# Patient Record
Sex: Male | Born: 1935 | Race: Black or African American | Hispanic: No | Marital: Married | Smoking: Former smoker
Health system: Southern US, Community
[De-identification: ages and names within clinical notes are randomized; demographics above are authoritative.]

## PROBLEM LIST (undated history)

## (undated) DIAGNOSIS — C189 Malignant neoplasm of colon, unspecified: Secondary | ICD-10-CM

## (undated) DIAGNOSIS — E7800 Pure hypercholesterolemia, unspecified: Secondary | ICD-10-CM

## (undated) DIAGNOSIS — I10 Essential (primary) hypertension: Secondary | ICD-10-CM

## (undated) HISTORY — PX: COLON SURGERY: SHX602

---

## 2004-12-13 ENCOUNTER — Emergency Department: Payer: Self-pay | Admitting: General Practice

## 2005-01-07 ENCOUNTER — Ambulatory Visit: Payer: Self-pay | Admitting: Specialist

## 2006-01-12 ENCOUNTER — Ambulatory Visit: Payer: Self-pay | Admitting: Urology

## 2007-07-24 ENCOUNTER — Ambulatory Visit: Payer: Self-pay | Admitting: Gastroenterology

## 2008-10-08 ENCOUNTER — Ambulatory Visit: Payer: Self-pay | Admitting: Internal Medicine

## 2011-02-08 ENCOUNTER — Ambulatory Visit: Payer: Self-pay | Admitting: Internal Medicine

## 2012-07-11 ENCOUNTER — Ambulatory Visit: Payer: Self-pay | Admitting: Urology

## 2012-07-11 LAB — HEMOGLOBIN: HGB: 13.9 g/dL (ref 13.0–18.0)

## 2012-07-16 ENCOUNTER — Ambulatory Visit: Payer: Self-pay | Admitting: Urology

## 2012-12-10 ENCOUNTER — Emergency Department: Payer: Self-pay | Admitting: Emergency Medicine

## 2014-08-21 DIAGNOSIS — I10 Essential (primary) hypertension: Secondary | ICD-10-CM | POA: Insufficient documentation

## 2014-11-13 DIAGNOSIS — I10 Essential (primary) hypertension: Secondary | ICD-10-CM | POA: Diagnosis not present

## 2014-11-21 DIAGNOSIS — E785 Hyperlipidemia, unspecified: Secondary | ICD-10-CM | POA: Insufficient documentation

## 2014-11-24 DIAGNOSIS — H4011X2 Primary open-angle glaucoma, moderate stage: Secondary | ICD-10-CM | POA: Diagnosis not present

## 2015-02-13 DIAGNOSIS — I10 Essential (primary) hypertension: Secondary | ICD-10-CM | POA: Diagnosis not present

## 2015-02-20 DIAGNOSIS — N289 Disorder of kidney and ureter, unspecified: Secondary | ICD-10-CM | POA: Diagnosis not present

## 2015-02-20 DIAGNOSIS — I10 Essential (primary) hypertension: Secondary | ICD-10-CM | POA: Diagnosis not present

## 2015-02-24 NOTE — H&P (Signed)
PATIENT NAME:  Gabriel Thompson, Gabriel Thompson MR#:  [id number] DATE OF BIRTH:  [birth year]  DATE OF ADMISSION:  07/16/2012  CHIEF COMPLAINT: Difficulty voiding.   HISTORY OF PRESENT ILLNESS: Mr. Balsam is a 81 year old African American male with a long history of difficulty voiding. He has been treated with finasteride 5 mg per day for a number of years. His symptoms have worsened and his PSA has risen. He was evaluated with an ultrasound-guided biopsy of the prostate June 21st which indicated a 64.9 gram prostate with benign histology. He comes in now for photovaporization of the prostate with green light laser.  ALLERGIES: No drug allergies.   CURRENT MEDICATIONS:  1. Finasteride. 2. HCTZ. 3. Simvastatin.   PAST SURGICAL HISTORY: TUMT in 2010.   SOCIAL HISTORY: The patient quit smoking 20 years ago. He denied alcohol use.   FAMILY HISTORY: Noncontributory.   PAST AND CURRENT MEDICAL CONDITIONS:  1. Allergic rhinitis.  2. Hypertension. 3. Hypercholesterolemia.  REVIEW OF SYSTEMS: The patient denied chest pain, shortness of breath, diabetes, or stroke.   PHYSICAL EXAMINATION:    GENERAL: Well nourished African American male in no acute distress.   HEENT: Sclerae were clear. Pupils were equally round and reactive to light and accommodation. Extraocular movements were intact.   NECK: Supple. No palpable adenopathy. No audible carotid bruits.   LUNGS: Clear to auscultation.   CARDIOVASCULAR: Regular rhythm and rate without audible murmurs.   ABDOMEN: Soft, nontender abdomen.   GENITOURINARY: Uncircumcised. Testes were smooth and nontender, 25 mL in size each.   RECTAL: Greater than 50 gram smooth nontender prostate.   NEUROMUSCULAR: Alert and oriented x3.   IMPRESSION:  1. Benign prostatic hypertrophy with bladder outlet obstruction.  2. Elevated PSA.   PLAN: Photovaporization of the prostate with the green light laser.   ____________________________ Otelia Limes. Yves Dill,  MD [email or web address] D: 07/11/2012 12:58:04 ET T: 07/11/2012 13:26:05 ET JOB#: [id number]  cc: Otelia Limes. Yves Dill, MD, <Dictator> Royston Cowper MD ELECTRONICALLY SIGNED 07/12/2012 9:14

## 2015-02-24 NOTE — Op Note (Signed)
PATIENT NAME:  Gabriel Thompson, Gabriel Thompson MR#:  [id number] DATE OF BIRTH:  20-Feb-1934  DATE OF PROCEDURE:  07/16/2012  PREOPERATIVE DIAGNOSIS: Benign prostatic hypertrophy with bladder outlet obstruction.   POSTOPERATIVE DIAGNOSIS: Benign prostatic hypertrophy with bladder outlet obstruction.   PROCEDURE PERFORMED: Photovaporization of the prostate with GreenLight laser.   SURGEON: Otelia Limes. Yves Dill, MD  ANESTHETIST: Dr. [place] Service  ANESTHETIC METHOD: General.   INDICATIONS: See the dictated history and physical. After informed consent, patient requests above procedure.   OPERATIVE SUMMARY: After adequate anesthesia had been obtained, patient was placed into dorsal lithotomy position and the perineum was prepped and draped in the usual fashion. Laser scope was coupled with the camera and then visually advanced into the bladder. Bladder was heavily trabeculated. Both ureteral orifices were identified and had clear efflux. No bladder tumors were identified. Patient had lateral lobe prostatic hypertrophy. At this point, the XPS laser fiber was introduced through the scope and power setting placed at 80 watts. Bladder neck tissue was vaporized. Power was then increased to 150 watts and midportion of the prostatic tissue was vaporized. Finally, the power was increased to 180 watts and remaining obstructive tissue from the bladder neck to the verumontanum was vaporized. At this point, the scope was removed and a 20 [place] silicone catheter placed. Catheter was irrigated until clear. A B and O suppository was placed. Procedure was then terminated and patient was transferred to the recovery room in stable condition.    ____________________________ Otelia Limes. Yves Dill, MD mrw:cms D: 07/16/2012 09:20:16 ET T: 07/16/2012 11:33:34 ET  JOB#: [id number] cc: Otelia Limes. Yves Dill, MD, <Dictator> Royston Cowper MD ELECTRONICALLY SIGNED 07/17/2012 8:21

## 2015-03-18 DIAGNOSIS — H4011X2 Primary open-angle glaucoma, moderate stage: Secondary | ICD-10-CM | POA: Diagnosis not present

## 2015-03-18 DIAGNOSIS — H5210 Myopia, unspecified eye: Secondary | ICD-10-CM | POA: Diagnosis not present

## 2015-03-18 DIAGNOSIS — Z0100 Encounter for examination of eyes and vision without abnormal findings: Secondary | ICD-10-CM | POA: Diagnosis not present

## 2015-03-18 DIAGNOSIS — Z961 Presence of intraocular lens: Secondary | ICD-10-CM | POA: Diagnosis not present

## 2015-03-18 DIAGNOSIS — H524 Presbyopia: Secondary | ICD-10-CM | POA: Diagnosis not present

## 2015-05-27 DIAGNOSIS — I10 Essential (primary) hypertension: Secondary | ICD-10-CM | POA: Diagnosis not present

## 2015-06-03 DIAGNOSIS — N289 Disorder of kidney and ureter, unspecified: Secondary | ICD-10-CM | POA: Diagnosis not present

## 2015-06-03 DIAGNOSIS — I10 Essential (primary) hypertension: Secondary | ICD-10-CM | POA: Diagnosis not present

## 2015-06-03 DIAGNOSIS — Z125 Encounter for screening for malignant neoplasm of prostate: Secondary | ICD-10-CM | POA: Diagnosis not present

## 2015-07-22 DIAGNOSIS — H4011X3 Primary open-angle glaucoma, severe stage: Secondary | ICD-10-CM | POA: Diagnosis not present

## 2015-08-21 DIAGNOSIS — H43811 Vitreous degeneration, right eye: Secondary | ICD-10-CM | POA: Diagnosis not present

## 2015-11-12 DIAGNOSIS — H401132 Primary open-angle glaucoma, bilateral, moderate stage: Secondary | ICD-10-CM | POA: Diagnosis not present

## 2015-11-19 DIAGNOSIS — Z125 Encounter for screening for malignant neoplasm of prostate: Secondary | ICD-10-CM | POA: Diagnosis not present

## 2015-11-19 DIAGNOSIS — I10 Essential (primary) hypertension: Secondary | ICD-10-CM | POA: Diagnosis not present

## 2015-11-19 DIAGNOSIS — N390 Urinary tract infection, site not specified: Secondary | ICD-10-CM | POA: Diagnosis not present

## 2015-11-26 DIAGNOSIS — N390 Urinary tract infection, site not specified: Secondary | ICD-10-CM | POA: Diagnosis not present

## 2015-11-26 DIAGNOSIS — R972 Elevated prostate specific antigen [PSA]: Secondary | ICD-10-CM | POA: Diagnosis not present

## 2015-11-26 DIAGNOSIS — Z0001 Encounter for general adult medical examination with abnormal findings: Secondary | ICD-10-CM | POA: Diagnosis not present

## 2015-11-26 DIAGNOSIS — N289 Disorder of kidney and ureter, unspecified: Secondary | ICD-10-CM | POA: Diagnosis not present

## 2015-11-26 DIAGNOSIS — I10 Essential (primary) hypertension: Secondary | ICD-10-CM | POA: Diagnosis not present

## 2015-11-26 DIAGNOSIS — E7800 Pure hypercholesterolemia, unspecified: Secondary | ICD-10-CM | POA: Diagnosis not present

## 2015-12-09 DIAGNOSIS — D400 Neoplasm of uncertain behavior of prostate: Secondary | ICD-10-CM | POA: Diagnosis not present

## 2015-12-09 DIAGNOSIS — N400 Enlarged prostate without lower urinary tract symptoms: Secondary | ICD-10-CM | POA: Diagnosis not present

## 2015-12-09 DIAGNOSIS — R351 Nocturia: Secondary | ICD-10-CM | POA: Diagnosis not present

## 2015-12-09 DIAGNOSIS — N401 Enlarged prostate with lower urinary tract symptoms: Secondary | ICD-10-CM | POA: Diagnosis not present

## 2015-12-09 DIAGNOSIS — R972 Elevated prostate specific antigen [PSA]: Secondary | ICD-10-CM | POA: Diagnosis not present

## 2015-12-15 DIAGNOSIS — R972 Elevated prostate specific antigen [PSA]: Secondary | ICD-10-CM | POA: Diagnosis not present

## 2015-12-15 DIAGNOSIS — N4231 Prostatic intraepithelial neoplasia: Secondary | ICD-10-CM | POA: Diagnosis not present

## 2015-12-24 DIAGNOSIS — R972 Elevated prostate specific antigen [PSA]: Secondary | ICD-10-CM | POA: Diagnosis not present

## 2015-12-24 DIAGNOSIS — N401 Enlarged prostate with lower urinary tract symptoms: Secondary | ICD-10-CM | POA: Diagnosis not present

## 2015-12-24 DIAGNOSIS — D400 Neoplasm of uncertain behavior of prostate: Secondary | ICD-10-CM | POA: Diagnosis not present

## 2016-02-11 DIAGNOSIS — H401131 Primary open-angle glaucoma, bilateral, mild stage: Secondary | ICD-10-CM | POA: Diagnosis not present

## 2016-02-17 DIAGNOSIS — R972 Elevated prostate specific antigen [PSA]: Secondary | ICD-10-CM | POA: Diagnosis not present

## 2016-02-17 DIAGNOSIS — I10 Essential (primary) hypertension: Secondary | ICD-10-CM | POA: Diagnosis not present

## 2016-02-24 DIAGNOSIS — N289 Disorder of kidney and ureter, unspecified: Secondary | ICD-10-CM | POA: Diagnosis not present

## 2016-02-24 DIAGNOSIS — I10 Essential (primary) hypertension: Secondary | ICD-10-CM | POA: Diagnosis not present

## 2016-02-24 DIAGNOSIS — D649 Anemia, unspecified: Secondary | ICD-10-CM | POA: Diagnosis not present

## 2016-05-20 DIAGNOSIS — D649 Anemia, unspecified: Secondary | ICD-10-CM | POA: Diagnosis not present

## 2016-05-20 DIAGNOSIS — N289 Disorder of kidney and ureter, unspecified: Secondary | ICD-10-CM | POA: Diagnosis not present

## 2016-05-26 DIAGNOSIS — I10 Essential (primary) hypertension: Secondary | ICD-10-CM | POA: Diagnosis not present

## 2016-05-26 DIAGNOSIS — D649 Anemia, unspecified: Secondary | ICD-10-CM | POA: Diagnosis not present

## 2016-06-08 DIAGNOSIS — R972 Elevated prostate specific antigen [PSA]: Secondary | ICD-10-CM | POA: Diagnosis not present

## 2016-06-08 DIAGNOSIS — N401 Enlarged prostate with lower urinary tract symptoms: Secondary | ICD-10-CM | POA: Diagnosis not present

## 2016-06-22 DIAGNOSIS — R972 Elevated prostate specific antigen [PSA]: Secondary | ICD-10-CM | POA: Diagnosis not present

## 2016-06-22 DIAGNOSIS — N401 Enlarged prostate with lower urinary tract symptoms: Secondary | ICD-10-CM | POA: Diagnosis not present

## 2016-06-22 DIAGNOSIS — R351 Nocturia: Secondary | ICD-10-CM | POA: Diagnosis not present

## 2016-06-22 DIAGNOSIS — D400 Neoplasm of uncertain behavior of prostate: Secondary | ICD-10-CM | POA: Diagnosis not present

## 2016-07-05 DIAGNOSIS — H40153 Residual stage of open-angle glaucoma, bilateral: Secondary | ICD-10-CM | POA: Diagnosis not present

## 2016-07-06 DIAGNOSIS — H5210 Myopia, unspecified eye: Secondary | ICD-10-CM | POA: Diagnosis not present

## 2016-07-06 DIAGNOSIS — H524 Presbyopia: Secondary | ICD-10-CM | POA: Diagnosis not present

## 2016-07-25 DIAGNOSIS — H905 Unspecified sensorineural hearing loss: Secondary | ICD-10-CM | POA: Diagnosis not present

## 2016-08-19 DIAGNOSIS — I10 Essential (primary) hypertension: Secondary | ICD-10-CM | POA: Diagnosis not present

## 2016-08-19 DIAGNOSIS — D649 Anemia, unspecified: Secondary | ICD-10-CM | POA: Diagnosis not present

## 2016-08-26 DIAGNOSIS — Z23 Encounter for immunization: Secondary | ICD-10-CM | POA: Diagnosis not present

## 2016-08-26 DIAGNOSIS — N289 Disorder of kidney and ureter, unspecified: Secondary | ICD-10-CM | POA: Diagnosis not present

## 2016-08-26 DIAGNOSIS — D649 Anemia, unspecified: Secondary | ICD-10-CM | POA: Diagnosis not present

## 2016-08-26 DIAGNOSIS — I10 Essential (primary) hypertension: Secondary | ICD-10-CM | POA: Diagnosis not present

## 2016-11-02 DIAGNOSIS — H40153 Residual stage of open-angle glaucoma, bilateral: Secondary | ICD-10-CM | POA: Diagnosis not present

## 2016-12-01 DIAGNOSIS — D649 Anemia, unspecified: Secondary | ICD-10-CM | POA: Diagnosis not present

## 2016-12-01 DIAGNOSIS — I10 Essential (primary) hypertension: Secondary | ICD-10-CM | POA: Diagnosis not present

## 2016-12-07 DIAGNOSIS — Z0000 Encounter for general adult medical examination without abnormal findings: Secondary | ICD-10-CM | POA: Diagnosis not present

## 2016-12-07 DIAGNOSIS — I10 Essential (primary) hypertension: Secondary | ICD-10-CM | POA: Diagnosis not present

## 2016-12-26 DIAGNOSIS — R351 Nocturia: Secondary | ICD-10-CM | POA: Diagnosis not present

## 2016-12-26 DIAGNOSIS — N401 Enlarged prostate with lower urinary tract symptoms: Secondary | ICD-10-CM | POA: Diagnosis not present

## 2016-12-26 DIAGNOSIS — Z79899 Other long term (current) drug therapy: Secondary | ICD-10-CM | POA: Diagnosis not present

## 2016-12-26 DIAGNOSIS — R972 Elevated prostate specific antigen [PSA]: Secondary | ICD-10-CM | POA: Diagnosis not present

## 2016-12-26 DIAGNOSIS — D400 Neoplasm of uncertain behavior of prostate: Secondary | ICD-10-CM | POA: Diagnosis not present

## 2017-02-28 DIAGNOSIS — I10 Essential (primary) hypertension: Secondary | ICD-10-CM | POA: Diagnosis not present

## 2017-03-02 DIAGNOSIS — H40153 Residual stage of open-angle glaucoma, bilateral: Secondary | ICD-10-CM | POA: Diagnosis not present

## 2017-03-07 DIAGNOSIS — Z0000 Encounter for general adult medical examination without abnormal findings: Secondary | ICD-10-CM | POA: Diagnosis not present

## 2017-03-07 DIAGNOSIS — N289 Disorder of kidney and ureter, unspecified: Secondary | ICD-10-CM | POA: Diagnosis not present

## 2017-03-07 DIAGNOSIS — E7800 Pure hypercholesterolemia, unspecified: Secondary | ICD-10-CM | POA: Diagnosis not present

## 2017-03-07 DIAGNOSIS — Z0001 Encounter for general adult medical examination with abnormal findings: Secondary | ICD-10-CM | POA: Diagnosis not present

## 2017-03-07 DIAGNOSIS — I10 Essential (primary) hypertension: Secondary | ICD-10-CM | POA: Diagnosis not present

## 2017-06-26 DIAGNOSIS — R351 Nocturia: Secondary | ICD-10-CM | POA: Diagnosis not present

## 2017-06-26 DIAGNOSIS — C61 Malignant neoplasm of prostate: Secondary | ICD-10-CM | POA: Diagnosis not present

## 2017-06-26 DIAGNOSIS — Z79899 Other long term (current) drug therapy: Secondary | ICD-10-CM | POA: Diagnosis not present

## 2017-06-26 DIAGNOSIS — R972 Elevated prostate specific antigen [PSA]: Secondary | ICD-10-CM | POA: Diagnosis not present

## 2017-06-26 DIAGNOSIS — D400 Neoplasm of uncertain behavior of prostate: Secondary | ICD-10-CM | POA: Diagnosis not present

## 2017-06-26 DIAGNOSIS — N401 Enlarged prostate with lower urinary tract symptoms: Secondary | ICD-10-CM | POA: Diagnosis not present

## 2017-07-03 DIAGNOSIS — H524 Presbyopia: Secondary | ICD-10-CM | POA: Diagnosis not present

## 2017-07-03 DIAGNOSIS — H40153 Residual stage of open-angle glaucoma, bilateral: Secondary | ICD-10-CM | POA: Diagnosis not present

## 2017-08-31 DIAGNOSIS — I10 Essential (primary) hypertension: Secondary | ICD-10-CM | POA: Diagnosis not present

## 2017-09-07 DIAGNOSIS — E7800 Pure hypercholesterolemia, unspecified: Secondary | ICD-10-CM | POA: Diagnosis not present

## 2017-09-07 DIAGNOSIS — Z23 Encounter for immunization: Secondary | ICD-10-CM | POA: Diagnosis not present

## 2017-09-07 DIAGNOSIS — N400 Enlarged prostate without lower urinary tract symptoms: Secondary | ICD-10-CM | POA: Diagnosis not present

## 2017-09-07 DIAGNOSIS — I10 Essential (primary) hypertension: Secondary | ICD-10-CM | POA: Diagnosis not present

## 2017-09-07 DIAGNOSIS — N289 Disorder of kidney and ureter, unspecified: Secondary | ICD-10-CM | POA: Diagnosis not present

## 2017-11-08 DIAGNOSIS — H40153 Residual stage of open-angle glaucoma, bilateral: Secondary | ICD-10-CM | POA: Diagnosis not present

## 2017-12-06 DIAGNOSIS — E7800 Pure hypercholesterolemia, unspecified: Secondary | ICD-10-CM | POA: Diagnosis not present

## 2017-12-06 DIAGNOSIS — N289 Disorder of kidney and ureter, unspecified: Secondary | ICD-10-CM | POA: Diagnosis not present

## 2017-12-11 DIAGNOSIS — N289 Disorder of kidney and ureter, unspecified: Secondary | ICD-10-CM | POA: Diagnosis not present

## 2017-12-11 DIAGNOSIS — I10 Essential (primary) hypertension: Secondary | ICD-10-CM | POA: Diagnosis not present

## 2017-12-11 DIAGNOSIS — E7800 Pure hypercholesterolemia, unspecified: Secondary | ICD-10-CM | POA: Diagnosis not present

## 2017-12-11 DIAGNOSIS — N400 Enlarged prostate without lower urinary tract symptoms: Secondary | ICD-10-CM | POA: Diagnosis not present

## 2018-01-01 DIAGNOSIS — R972 Elevated prostate specific antigen [PSA]: Secondary | ICD-10-CM | POA: Diagnosis not present

## 2018-01-01 DIAGNOSIS — Z79899 Other long term (current) drug therapy: Secondary | ICD-10-CM | POA: Diagnosis not present

## 2018-01-04 DIAGNOSIS — N401 Enlarged prostate with lower urinary tract symptoms: Secondary | ICD-10-CM | POA: Diagnosis not present

## 2018-01-04 DIAGNOSIS — Z79899 Other long term (current) drug therapy: Secondary | ICD-10-CM | POA: Diagnosis not present

## 2018-01-04 DIAGNOSIS — N390 Urinary tract infection, site not specified: Secondary | ICD-10-CM | POA: Diagnosis not present

## 2018-01-04 DIAGNOSIS — D400 Neoplasm of uncertain behavior of prostate: Secondary | ICD-10-CM | POA: Diagnosis not present

## 2018-03-07 DIAGNOSIS — H40153 Residual stage of open-angle glaucoma, bilateral: Secondary | ICD-10-CM | POA: Diagnosis not present

## 2018-06-08 DIAGNOSIS — N289 Disorder of kidney and ureter, unspecified: Secondary | ICD-10-CM | POA: Diagnosis not present

## 2018-06-11 DIAGNOSIS — N289 Disorder of kidney and ureter, unspecified: Secondary | ICD-10-CM | POA: Diagnosis not present

## 2018-06-11 DIAGNOSIS — Z0001 Encounter for general adult medical examination with abnormal findings: Secondary | ICD-10-CM | POA: Diagnosis not present

## 2018-06-11 DIAGNOSIS — I10 Essential (primary) hypertension: Secondary | ICD-10-CM | POA: Diagnosis not present

## 2018-06-11 DIAGNOSIS — Z0000 Encounter for general adult medical examination without abnormal findings: Secondary | ICD-10-CM | POA: Diagnosis not present

## 2018-06-11 DIAGNOSIS — E7800 Pure hypercholesterolemia, unspecified: Secondary | ICD-10-CM | POA: Diagnosis not present

## 2018-07-05 DIAGNOSIS — N401 Enlarged prostate with lower urinary tract symptoms: Secondary | ICD-10-CM | POA: Diagnosis not present

## 2018-07-05 DIAGNOSIS — Z79899 Other long term (current) drug therapy: Secondary | ICD-10-CM | POA: Diagnosis not present

## 2018-07-05 DIAGNOSIS — D400 Neoplasm of uncertain behavior of prostate: Secondary | ICD-10-CM | POA: Diagnosis not present

## 2018-07-05 DIAGNOSIS — N390 Urinary tract infection, site not specified: Secondary | ICD-10-CM | POA: Diagnosis not present

## 2018-07-17 DIAGNOSIS — H524 Presbyopia: Secondary | ICD-10-CM | POA: Diagnosis not present

## 2018-07-17 DIAGNOSIS — H40153 Residual stage of open-angle glaucoma, bilateral: Secondary | ICD-10-CM | POA: Diagnosis not present

## 2018-10-03 DIAGNOSIS — N289 Disorder of kidney and ureter, unspecified: Secondary | ICD-10-CM | POA: Diagnosis not present

## 2018-10-10 DIAGNOSIS — I10 Essential (primary) hypertension: Secondary | ICD-10-CM | POA: Diagnosis not present

## 2018-10-10 DIAGNOSIS — E7800 Pure hypercholesterolemia, unspecified: Secondary | ICD-10-CM | POA: Diagnosis not present

## 2018-10-10 DIAGNOSIS — N289 Disorder of kidney and ureter, unspecified: Secondary | ICD-10-CM | POA: Diagnosis not present

## 2018-10-10 DIAGNOSIS — N400 Enlarged prostate without lower urinary tract symptoms: Secondary | ICD-10-CM | POA: Diagnosis not present

## 2018-10-10 DIAGNOSIS — Z23 Encounter for immunization: Secondary | ICD-10-CM | POA: Diagnosis not present

## 2018-12-03 DIAGNOSIS — H40153 Residual stage of open-angle glaucoma, bilateral: Secondary | ICD-10-CM | POA: Diagnosis not present

## 2018-12-05 ENCOUNTER — Encounter: Payer: Self-pay | Admitting: Emergency Medicine

## 2018-12-05 ENCOUNTER — Emergency Department: Payer: Medicare HMO

## 2018-12-05 ENCOUNTER — Emergency Department
Admission: EM | Admit: 2018-12-05 | Discharge: 2018-12-05 | Disposition: A | Discharge status: Home / Self Care | Payer: Medicare HMO | Attending: Emergency Medicine | Admitting: Emergency Medicine

## 2018-12-05 ENCOUNTER — Other Ambulatory Visit: Payer: Self-pay

## 2018-12-05 DIAGNOSIS — M7918 Myalgia, other site: Secondary | ICD-10-CM | POA: Diagnosis not present

## 2018-12-05 DIAGNOSIS — Z79899 Other long term (current) drug therapy: Secondary | ICD-10-CM | POA: Diagnosis not present

## 2018-12-05 DIAGNOSIS — Z85038 Personal history of other malignant neoplasm of large intestine: Secondary | ICD-10-CM | POA: Diagnosis not present

## 2018-12-05 DIAGNOSIS — M791 Myalgia, unspecified site: Secondary | ICD-10-CM | POA: Diagnosis not present

## 2018-12-05 DIAGNOSIS — R0781 Pleurodynia: Secondary | ICD-10-CM | POA: Diagnosis not present

## 2018-12-05 DIAGNOSIS — I10 Essential (primary) hypertension: Secondary | ICD-10-CM | POA: Insufficient documentation

## 2018-12-05 DIAGNOSIS — R1013 Epigastric pain: Secondary | ICD-10-CM | POA: Diagnosis not present

## 2018-12-05 DIAGNOSIS — R079 Chest pain, unspecified: Secondary | ICD-10-CM | POA: Diagnosis not present

## 2018-12-05 DIAGNOSIS — Z87891 Personal history of nicotine dependence: Secondary | ICD-10-CM | POA: Insufficient documentation

## 2018-12-05 HISTORY — DX: Essential (primary) hypertension: I10

## 2018-12-05 HISTORY — DX: Pure hypercholesterolemia, unspecified: E78.00

## 2018-12-05 HISTORY — DX: Malignant neoplasm of colon, unspecified: C18.9

## 2018-12-05 LAB — CBC
HEMATOCRIT: 44.6 % (ref 39.0–52.0)
Hemoglobin: 14.1 g/dL (ref 13.0–17.0)
MCH: 29.1 pg (ref 26.0–34.0)
MCHC: 31.6 g/dL (ref 30.0–36.0)
MCV: 92.1 fL (ref 80.0–100.0)
Platelets: 229 10*3/uL (ref 150–400)
RBC: 4.84 MIL/uL (ref 4.22–5.81)
RDW: 12.9 % (ref 11.5–15.5)
WBC: 9.5 10*3/uL (ref 4.0–10.5)
nRBC: 0 % (ref 0.0–0.2)

## 2018-12-05 LAB — BASIC METABOLIC PANEL
ANION GAP: 7 (ref 5–15)
BUN: 17 mg/dL (ref 8–23)
CO2: 29 mmol/L (ref 22–32)
Calcium: 9.4 mg/dL (ref 8.9–10.3)
Chloride: 104 mmol/L (ref 98–111)
Creatinine, Ser: 1.33 mg/dL — ABNORMAL HIGH (ref 0.61–1.24)
GFR calc Af Amer: 57 mL/min — ABNORMAL LOW (ref >60)
GFR calc non Af Amer: 49 mL/min — ABNORMAL LOW (ref >60)
Glucose, Bld: 151 mg/dL — ABNORMAL HIGH (ref 70–99)
Potassium: 4.3 mmol/L (ref 3.5–5.1)
Sodium: 140 mmol/L (ref 135–145)

## 2018-12-05 LAB — LIPASE, BLOOD: Lipase: 36 U/L (ref 11–51)

## 2018-12-05 LAB — TROPONIN I: Troponin I: <0.03 ng/mL (ref <0.03)

## 2018-12-05 MED ORDER — NAPROXEN 375 MG PO TABS: 375.0000 mg | ORAL_TABLET | Freq: Two times a day (BID) | ORAL | 0 refills | Status: AC

## 2018-12-05 MED ORDER — MENTHOL-CAMPHOR 16-11 % EX CREA: 1.0000 "application " | TOPICAL_CREAM | Freq: Two times a day (BID) | CUTANEOUS | 0 refills | Status: AC | PRN

## 2018-12-05 MED ORDER — ALUM & MAG HYDROXIDE-SIMETH 200-200-20 MG/5ML PO SUSP
30.0000 mL | Freq: Once | ORAL | Status: AC
  Administered 2018-12-05: 30 mL via ORAL
  Filled 2018-12-05: qty 30

## 2018-12-05 MED ORDER — LIDOCAINE VISCOUS HCL 2 % MT SOLN
15.0000 mL | Freq: Once | OROMUCOSAL | Status: AC
  Administered 2018-12-05: 15 mL via OROMUCOSAL
  Filled 2018-12-05: qty 15

## 2018-12-05 NOTE — ED Notes (Signed)
Pt states relief of pain after receiving Maalox and Lidocaine combination.

## 2018-12-05 NOTE — ED Notes (Signed)
Recollect of green tube sent to lab.

## 2018-12-05 NOTE — ED Notes (Signed)
Unable to obtain E-sig, paper copy signed for electronic records.

## 2018-12-05 NOTE — ED Provider Notes (Signed)
[facility] Emergency Department Provider Note  ____________________________________________   First MD Initiated Contact with Patient 12/05/18 1532     (approximate)  I have reviewed the triage vital signs and the nursing notes.   HISTORY  Chief Complaint Abdominal Pain   HPI Gabriel Thompson is a 85 y.o. male with a history of rustic cancer, hypertension and high cholesterol who is presenting emergency department with 24 hours of epigastric pain.  He says the pain started yesterday but is worsened and now has gotten better.  He states that the pain is worse with deep breathing as well as movement and eating.  Does not worsen when he lays back.  No nausea or vomiting.  Denies any chest pain, or shortness of breath.  Denies any diarrhea.  Denies any pain with urination.  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  Denies any radiation through to the back.  Patient denies any abdominal surgeries.  Asked about history of colon cancer listed on his past medical record and he denies this.    Past Medical History:  Diagnosis Date  . Colon cancer ([place])   . High cholesterol   . Hypertension     There are no active problems to display for this patient.   Past Surgical History:  Procedure Laterality Date  . COLON SURGERY      Prior to Admission medications   Medication Sig Start Date End Date Taking? Authorizing Provider  bicalutamide (CASODEX) 50 MG tablet Take 50 mg by mouth daily. 12/24/17  Yes [provider]  carvedilol (COREG) 6.25 MG tablet Take 6.25 mg by mouth 2 (two) times daily. 09/13/18  Yes [provider]  finasteride (PROSCAR) 5 MG tablet Take 5 mg by mouth daily.   Yes [provider]  hydrochlorothiazide (HYDRODIURIL) 25 MG tablet Take 25 mg by mouth daily. 09/13/18  Yes [provider]  simvastatin (ZOCOR) 40 MG tablet Take 40 mg by  mouth daily. 09/12/18  Yes [provider]    Allergies Patient has no allergy information on record.  No family history on file.  Social History Social History   Tobacco Use  . Smoking status: Former Research scientist (life sciences)  . Smokeless tobacco: Never Used  Substance Use Topics  . Alcohol use: Never    Frequency: Never  . Drug use: Never    Review of Systems  Constitutional: No fever/chills Eyes: No visual changes. ENT: No sore throat. Cardiovascular: Denies chest pain. Respiratory: Denies shortness of breath. Gastrointestinal:   No nausea, no vomiting.  No diarrhea.  No constipation. Genitourinary: Negative for dysuria. Musculoskeletal: Negative for back pain. Skin: Negative for rash. Neurological: Negative for headaches, focal weakness or numbness.   ____________________________________________   PHYSICAL EXAM:  VITAL SIGNS: ED Triage Vitals  Enc Vitals Group     BP 12/05/18 1420 (!) 205/89     Pulse Rate 12/05/18 1420 75     Resp 12/05/18 1420 18     Temp 12/05/18 1420 98.5 F (36.9 C)     Temp Source 12/05/18 1420 Oral     SpO2 12/05/18 1420 97 %     Weight 12/05/18 1421 150 lb (68 kg)     Height 12/05/18 1421 5\' 6"  (1.676 m)     Head Circumference --      Peak Flow --      Pain Score 12/05/18 1421 8  Pain Loc --      Pain Edu? --      Excl. in [place]? --     Constitutional: Alert and oriented. Well appearing and in no acute distress. Eyes: Conjunctivae are normal.  Head: Atraumatic. Nose: No congestion/rhinnorhea. Mouth/Throat: Mucous membranes are moist.  Neck: No stridor.   Cardiovascular: Normal rate, regular rhythm. Grossly normal heart sounds.  Respiratory: Normal respiratory effort.  No retractions. Lungs CTAB. Gastrointestinal: Soft with minimal tenderness to palpation to the epigastrium.  Negative Murphy sign.  No rebound or guarding.  No distention. No CVA tenderness. Musculoskeletal: No lower extremity tenderness nor edema.  No joint  effusions. Neurologic:  Normal speech and language. No gross focal neurologic deficits are appreciated. Skin:  Skin is warm, dry and intact. No rash noted. Psychiatric: Mood and affect are normal. Speech and behavior are normal.  ____________________________________________   LABS (all labs ordered are listed, but only abnormal results are displayed)  Labs Reviewed  BASIC METABOLIC PANEL - Abnormal; Notable for the following components:      Result Value   Glucose, Bld 151 (*)    Creatinine, Ser 1.33 (*)    GFR calc non Af Amer 49 (*)    GFR calc Af Amer 57 (*)    All other components within normal limits  CBC  TROPONIN I  LIPASE, BLOOD   ____________________________________________  EKG  ED ECG REPORT I, Doran Stabler, the attending physician, personally viewed and interpreted this ECG.   Date: 12/05/2018  EKG Time: 1420  Rate: 79  Rhythm: normal sinus rhythm  Axis: Normal  Intervals:none  ST&T Change: No ST segment elevation or depression.  No abnormal T wave inversion.  ____________________________________________  RADIOLOGY  Chest x-ray with mild bibasilar subsegmental atelectasis ____________________________________________   PROCEDURES  Procedure(s) performed:   Procedures  Critical Care performed:   ____________________________________________   INITIAL IMPRESSION / ASSESSMENT AND PLAN / ED COURSE  Pertinent labs & imaging results that were available during my care of the patient were reviewed by me and considered in my medical decision making (see chart for details).  Differential diagnosis includes, but is not limited to, biliary disease (biliary colic, acute cholecystitis, cholangitis, choledocholithiasis, etc), intrathoracic causes for epigastric abdominal pain including ACS, gastritis, duodenitis, pancreatitis, small bowel or large bowel obstruction, abdominal aortic aneurysm, hernia, and ulcer(s). As part of my medical decision making, I  reviewed the following data within the electronic MEDICAL RECORD NUMBER Notes from prior outpatient visits  ----------------------------------------- 5:05 PM on 12/05/2018 -----------------------------------------  Patient reporting pain now right over the "bone" at the bottom of his rib cage.  He is indeed tender right over the xiphoid process..  No ecchymosis there no deformity.  I do not feel the bone moving when I push on it.  The pain is mild.  There is no tenderness to the remaining areas of the abdomen.  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  Only minimal relief with antacids.  Likely musculoskeletal pain.  Very reassuring lab work as well as EKG.  Patient will be discharged with Naprosyn as well as icy hot.  Do not believe that this is aortic in origin.  Patient and family aware of diagnosis as well as to return for any worsening concerning symptoms for likely advanced imaging if pain does not improve or worsens.  They understand the diagnosis well treatment and willing to comply. ____________________________________________   FINAL  CLINICAL IMPRESSION(S) / ED DIAGNOSES  Abdominal pain.  Musculoskeletal pain.  NEW MEDICATIONS STARTED DURING THIS VISIT:  New Prescriptions   No medications on file     Note:  This document was prepared using Dragon voice recognition software and may include unintentional dictation errors.     Orbie Pyo, MD 12/05/18 [phone]

## 2018-12-05 NOTE — ED Triage Notes (Signed)
PT arrives with complaints of upper mid abdominal pain that started yesterday. Pt states the pain was minimal yesterday but has increased in intensity today. Pt appear uncomfortable in triage. Pt denies any radiation of pain to his back. Pt reports the pain is worse with deep breaths.

## 2018-12-05 NOTE — ED Notes (Signed)
NAD noted at time of D/C. Pt denies questions or concerns. Pt ambulatory to the lobby at this time. Pt's BP reviewed with MD prior to D/C. Per MD pt okay for D/C and take missed home BP meds when he gets home. Pt states understanding.

## 2018-12-12 DIAGNOSIS — J90 Pleural effusion, not elsewhere classified: Secondary | ICD-10-CM | POA: Diagnosis not present

## 2018-12-12 DIAGNOSIS — R0789 Other chest pain: Secondary | ICD-10-CM | POA: Diagnosis not present

## 2019-01-03 DIAGNOSIS — R972 Elevated prostate specific antigen [PSA]: Secondary | ICD-10-CM | POA: Diagnosis not present

## 2019-01-03 DIAGNOSIS — N401 Enlarged prostate with lower urinary tract symptoms: Secondary | ICD-10-CM | POA: Diagnosis not present

## 2019-01-03 DIAGNOSIS — D400 Neoplasm of uncertain behavior of prostate: Secondary | ICD-10-CM | POA: Diagnosis not present

## 2019-01-03 DIAGNOSIS — Z79899 Other long term (current) drug therapy: Secondary | ICD-10-CM | POA: Diagnosis not present

## 2019-01-04 DIAGNOSIS — J189 Pneumonia, unspecified organism: Secondary | ICD-10-CM | POA: Diagnosis not present

## 2019-01-04 DIAGNOSIS — J181 Lobar pneumonia, unspecified organism: Secondary | ICD-10-CM | POA: Diagnosis not present

## 2019-01-09 ENCOUNTER — Other Ambulatory Visit: Payer: Self-pay | Admitting: Urology

## 2019-01-09 DIAGNOSIS — R972 Elevated prostate specific antigen [PSA]: Secondary | ICD-10-CM

## 2019-01-18 ENCOUNTER — Ambulatory Visit
Admission: RE | Admit: 2019-01-18 | Discharge: 2019-01-18 | Disposition: A | Discharge status: Home / Self Care | Payer: Medicare HMO | Source: Ambulatory Visit | Attending: Urology | Admitting: Urology

## 2019-01-18 ENCOUNTER — Other Ambulatory Visit: Payer: Self-pay

## 2019-01-18 DIAGNOSIS — R972 Elevated prostate specific antigen [PSA]: Secondary | ICD-10-CM | POA: Insufficient documentation

## 2019-01-18 LAB — POCT I-STAT CREATININE: Creatinine, Ser: 1.4 mg/dL — ABNORMAL HIGH (ref 0.61–1.24)

## 2019-01-18 MED ORDER — GADOBUTROL 1 MMOL/ML IV SOLN
6.0000 mL | Freq: Once | INTRAVENOUS | Status: AC | PRN
  Administered 2019-01-18: 6 mL via INTRAVENOUS

## 2019-01-23 DIAGNOSIS — R972 Elevated prostate specific antigen [PSA]: Secondary | ICD-10-CM | POA: Diagnosis not present

## 2019-01-23 DIAGNOSIS — N401 Enlarged prostate with lower urinary tract symptoms: Secondary | ICD-10-CM | POA: Diagnosis not present

## 2019-04-04 DIAGNOSIS — N289 Disorder of kidney and ureter, unspecified: Secondary | ICD-10-CM | POA: Diagnosis not present

## 2019-04-04 DIAGNOSIS — E7800 Pure hypercholesterolemia, unspecified: Secondary | ICD-10-CM | POA: Diagnosis not present

## 2019-04-11 DIAGNOSIS — Z0000 Encounter for general adult medical examination without abnormal findings: Secondary | ICD-10-CM | POA: Diagnosis not present

## 2019-04-11 DIAGNOSIS — N400 Enlarged prostate without lower urinary tract symptoms: Secondary | ICD-10-CM | POA: Diagnosis not present

## 2019-04-11 DIAGNOSIS — N289 Disorder of kidney and ureter, unspecified: Secondary | ICD-10-CM | POA: Diagnosis not present

## 2019-04-11 DIAGNOSIS — H40153 Residual stage of open-angle glaucoma, bilateral: Secondary | ICD-10-CM | POA: Diagnosis not present

## 2019-04-11 DIAGNOSIS — I10 Essential (primary) hypertension: Secondary | ICD-10-CM | POA: Diagnosis not present

## 2019-04-11 DIAGNOSIS — E7800 Pure hypercholesterolemia, unspecified: Secondary | ICD-10-CM | POA: Diagnosis not present

## 2019-06-22 ENCOUNTER — Other Ambulatory Visit: Payer: Self-pay | Admitting: Radiology

## 2019-06-22 DIAGNOSIS — R6889 Other general symptoms and signs: Secondary | ICD-10-CM | POA: Diagnosis not present

## 2019-06-22 DIAGNOSIS — Z20822 Contact with and (suspected) exposure to covid-19: Secondary | ICD-10-CM

## 2019-06-23 LAB — NOVEL CORONAVIRUS, NAA: SARS-CoV-2, NAA: NOT DETECTED

## 2019-07-03 DIAGNOSIS — N401 Enlarged prostate with lower urinary tract symptoms: Secondary | ICD-10-CM | POA: Diagnosis not present

## 2019-07-03 DIAGNOSIS — R972 Elevated prostate specific antigen [PSA]: Secondary | ICD-10-CM | POA: Diagnosis not present

## 2019-07-03 DIAGNOSIS — D400 Neoplasm of uncertain behavior of prostate: Secondary | ICD-10-CM | POA: Diagnosis not present

## 2019-08-05 DIAGNOSIS — E7800 Pure hypercholesterolemia, unspecified: Secondary | ICD-10-CM | POA: Diagnosis not present

## 2019-08-05 DIAGNOSIS — I10 Essential (primary) hypertension: Secondary | ICD-10-CM | POA: Diagnosis not present

## 2019-08-12 DIAGNOSIS — E7800 Pure hypercholesterolemia, unspecified: Secondary | ICD-10-CM | POA: Diagnosis not present

## 2019-08-12 DIAGNOSIS — Z79899 Other long term (current) drug therapy: Secondary | ICD-10-CM | POA: Diagnosis not present

## 2019-08-12 DIAGNOSIS — N289 Disorder of kidney and ureter, unspecified: Secondary | ICD-10-CM | POA: Diagnosis not present

## 2019-08-12 DIAGNOSIS — Z87891 Personal history of nicotine dependence: Secondary | ICD-10-CM | POA: Diagnosis not present

## 2019-08-12 DIAGNOSIS — Z23 Encounter for immunization: Secondary | ICD-10-CM | POA: Diagnosis not present

## 2019-08-12 DIAGNOSIS — I10 Essential (primary) hypertension: Secondary | ICD-10-CM | POA: Diagnosis not present

## 2019-08-12 DIAGNOSIS — Z0001 Encounter for general adult medical examination with abnormal findings: Secondary | ICD-10-CM | POA: Diagnosis not present

## 2019-08-29 DIAGNOSIS — H40153 Residual stage of open-angle glaucoma, bilateral: Secondary | ICD-10-CM | POA: Diagnosis not present

## 2019-10-12 ENCOUNTER — Other Ambulatory Visit: Payer: Self-pay

## 2019-10-12 DIAGNOSIS — Z20822 Contact with and (suspected) exposure to covid-19: Secondary | ICD-10-CM

## 2019-10-15 LAB — NOVEL CORONAVIRUS, NAA: SARS-CoV-2, NAA: NOT DETECTED

## 2019-10-16 ENCOUNTER — Telehealth: Payer: Self-pay | Admitting: Hematology

## 2019-10-16 NOTE — Telephone Encounter (Signed)
Pt is aware covid 19 test is neg on 10-16-2019

## 2019-11-10 DIAGNOSIS — Z20828 Contact with and (suspected) exposure to other viral communicable diseases: Secondary | ICD-10-CM | POA: Diagnosis not present

## 2019-12-09 DIAGNOSIS — N289 Disorder of kidney and ureter, unspecified: Secondary | ICD-10-CM | POA: Diagnosis not present

## 2019-12-12 DIAGNOSIS — H40153 Residual stage of open-angle glaucoma, bilateral: Secondary | ICD-10-CM | POA: Diagnosis not present

## 2019-12-16 DIAGNOSIS — Z79899 Other long term (current) drug therapy: Secondary | ICD-10-CM | POA: Diagnosis not present

## 2019-12-16 DIAGNOSIS — N400 Enlarged prostate without lower urinary tract symptoms: Secondary | ICD-10-CM | POA: Diagnosis not present

## 2019-12-16 DIAGNOSIS — E7800 Pure hypercholesterolemia, unspecified: Secondary | ICD-10-CM | POA: Diagnosis not present

## 2019-12-16 DIAGNOSIS — Z87891 Personal history of nicotine dependence: Secondary | ICD-10-CM | POA: Diagnosis not present

## 2019-12-16 DIAGNOSIS — N289 Disorder of kidney and ureter, unspecified: Secondary | ICD-10-CM | POA: Diagnosis not present

## 2019-12-16 DIAGNOSIS — I10 Essential (primary) hypertension: Secondary | ICD-10-CM | POA: Diagnosis not present

## 2020-01-03 DIAGNOSIS — D400 Neoplasm of uncertain behavior of prostate: Secondary | ICD-10-CM | POA: Diagnosis not present

## 2020-01-03 DIAGNOSIS — R972 Elevated prostate specific antigen [PSA]: Secondary | ICD-10-CM | POA: Diagnosis not present

## 2020-01-03 DIAGNOSIS — Z79899 Other long term (current) drug therapy: Secondary | ICD-10-CM | POA: Diagnosis not present

## 2020-01-03 DIAGNOSIS — N401 Enlarged prostate with lower urinary tract symptoms: Secondary | ICD-10-CM | POA: Diagnosis not present

## 2020-01-03 DIAGNOSIS — N390 Urinary tract infection, site not specified: Secondary | ICD-10-CM | POA: Diagnosis not present

## 2020-04-16 DIAGNOSIS — H40153 Residual stage of open-angle glaucoma, bilateral: Secondary | ICD-10-CM | POA: Diagnosis not present

## 2020-06-08 DIAGNOSIS — N289 Disorder of kidney and ureter, unspecified: Secondary | ICD-10-CM | POA: Diagnosis not present

## 2020-06-08 DIAGNOSIS — E7800 Pure hypercholesterolemia, unspecified: Secondary | ICD-10-CM | POA: Diagnosis not present

## 2020-06-23 DIAGNOSIS — N400 Enlarged prostate without lower urinary tract symptoms: Secondary | ICD-10-CM | POA: Diagnosis not present

## 2020-06-23 DIAGNOSIS — I10 Essential (primary) hypertension: Secondary | ICD-10-CM | POA: Diagnosis not present

## 2020-06-23 DIAGNOSIS — N289 Disorder of kidney and ureter, unspecified: Secondary | ICD-10-CM | POA: Diagnosis not present

## 2020-06-23 DIAGNOSIS — Z0000 Encounter for general adult medical examination without abnormal findings: Secondary | ICD-10-CM | POA: Diagnosis not present

## 2020-06-23 DIAGNOSIS — E7800 Pure hypercholesterolemia, unspecified: Secondary | ICD-10-CM | POA: Diagnosis not present

## 2020-07-21 DIAGNOSIS — Z79899 Other long term (current) drug therapy: Secondary | ICD-10-CM | POA: Diagnosis not present

## 2020-07-21 DIAGNOSIS — N401 Enlarged prostate with lower urinary tract symptoms: Secondary | ICD-10-CM | POA: Diagnosis not present

## 2020-07-21 DIAGNOSIS — D400 Neoplasm of uncertain behavior of prostate: Secondary | ICD-10-CM | POA: Diagnosis not present

## 2020-07-28 ENCOUNTER — Other Ambulatory Visit: Payer: Self-pay | Admitting: Urology

## 2020-07-28 DIAGNOSIS — R972 Elevated prostate specific antigen [PSA]: Secondary | ICD-10-CM

## 2020-08-13 ENCOUNTER — Other Ambulatory Visit: Payer: Self-pay

## 2020-08-13 ENCOUNTER — Ambulatory Visit
Admission: RE | Admit: 2020-08-13 | Discharge: 2020-08-13 | Disposition: A | Discharge status: Home / Self Care | Payer: Medicare HMO | Source: Ambulatory Visit | Attending: Urology | Admitting: Urology

## 2020-08-13 DIAGNOSIS — R972 Elevated prostate specific antigen [PSA]: Secondary | ICD-10-CM | POA: Diagnosis not present

## 2020-08-13 DIAGNOSIS — N400 Enlarged prostate without lower urinary tract symptoms: Secondary | ICD-10-CM | POA: Diagnosis not present

## 2020-08-13 DIAGNOSIS — R937 Abnormal findings on diagnostic imaging of other parts of musculoskeletal system: Secondary | ICD-10-CM | POA: Diagnosis not present

## 2020-08-13 DIAGNOSIS — N4289 Other specified disorders of prostate: Secondary | ICD-10-CM | POA: Diagnosis not present

## 2020-08-13 MED ORDER — GADOBUTROL 1 MMOL/ML IV SOLN
7.0000 mL | Freq: Once | INTRAVENOUS | Status: AC | PRN
  Administered 2020-08-13: 7 mL via INTRAVENOUS

## 2020-08-17 DIAGNOSIS — R972 Elevated prostate specific antigen [PSA]: Secondary | ICD-10-CM | POA: Diagnosis not present

## 2020-08-17 DIAGNOSIS — N401 Enlarged prostate with lower urinary tract symptoms: Secondary | ICD-10-CM | POA: Diagnosis not present

## 2020-08-17 DIAGNOSIS — D400 Neoplasm of uncertain behavior of prostate: Secondary | ICD-10-CM | POA: Diagnosis not present

## 2020-08-31 DIAGNOSIS — H40153 Residual stage of open-angle glaucoma, bilateral: Secondary | ICD-10-CM | POA: Diagnosis not present

## 2020-12-14 DIAGNOSIS — H40153 Residual stage of open-angle glaucoma, bilateral: Secondary | ICD-10-CM | POA: Diagnosis not present

## 2020-12-16 DIAGNOSIS — I10 Essential (primary) hypertension: Secondary | ICD-10-CM | POA: Diagnosis not present

## 2020-12-23 DIAGNOSIS — N400 Enlarged prostate without lower urinary tract symptoms: Secondary | ICD-10-CM | POA: Diagnosis not present

## 2020-12-23 DIAGNOSIS — D367 Benign neoplasm of other specified sites: Secondary | ICD-10-CM | POA: Diagnosis not present

## 2020-12-23 DIAGNOSIS — I10 Essential (primary) hypertension: Secondary | ICD-10-CM | POA: Diagnosis not present

## 2020-12-23 DIAGNOSIS — N289 Disorder of kidney and ureter, unspecified: Secondary | ICD-10-CM | POA: Diagnosis not present

## 2020-12-23 DIAGNOSIS — E7800 Pure hypercholesterolemia, unspecified: Secondary | ICD-10-CM | POA: Diagnosis not present

## 2021-01-01 DIAGNOSIS — L720 Epidermal cyst: Secondary | ICD-10-CM | POA: Diagnosis not present

## 2021-01-04 IMAGING — CR DG CHEST 2V
2 series · 2 of 2 positions shown · non-contrast
Comparison: None.

CLINICAL DATA: Chest pain.

EXAM:
CHEST - 2 VIEW

[chest pa]
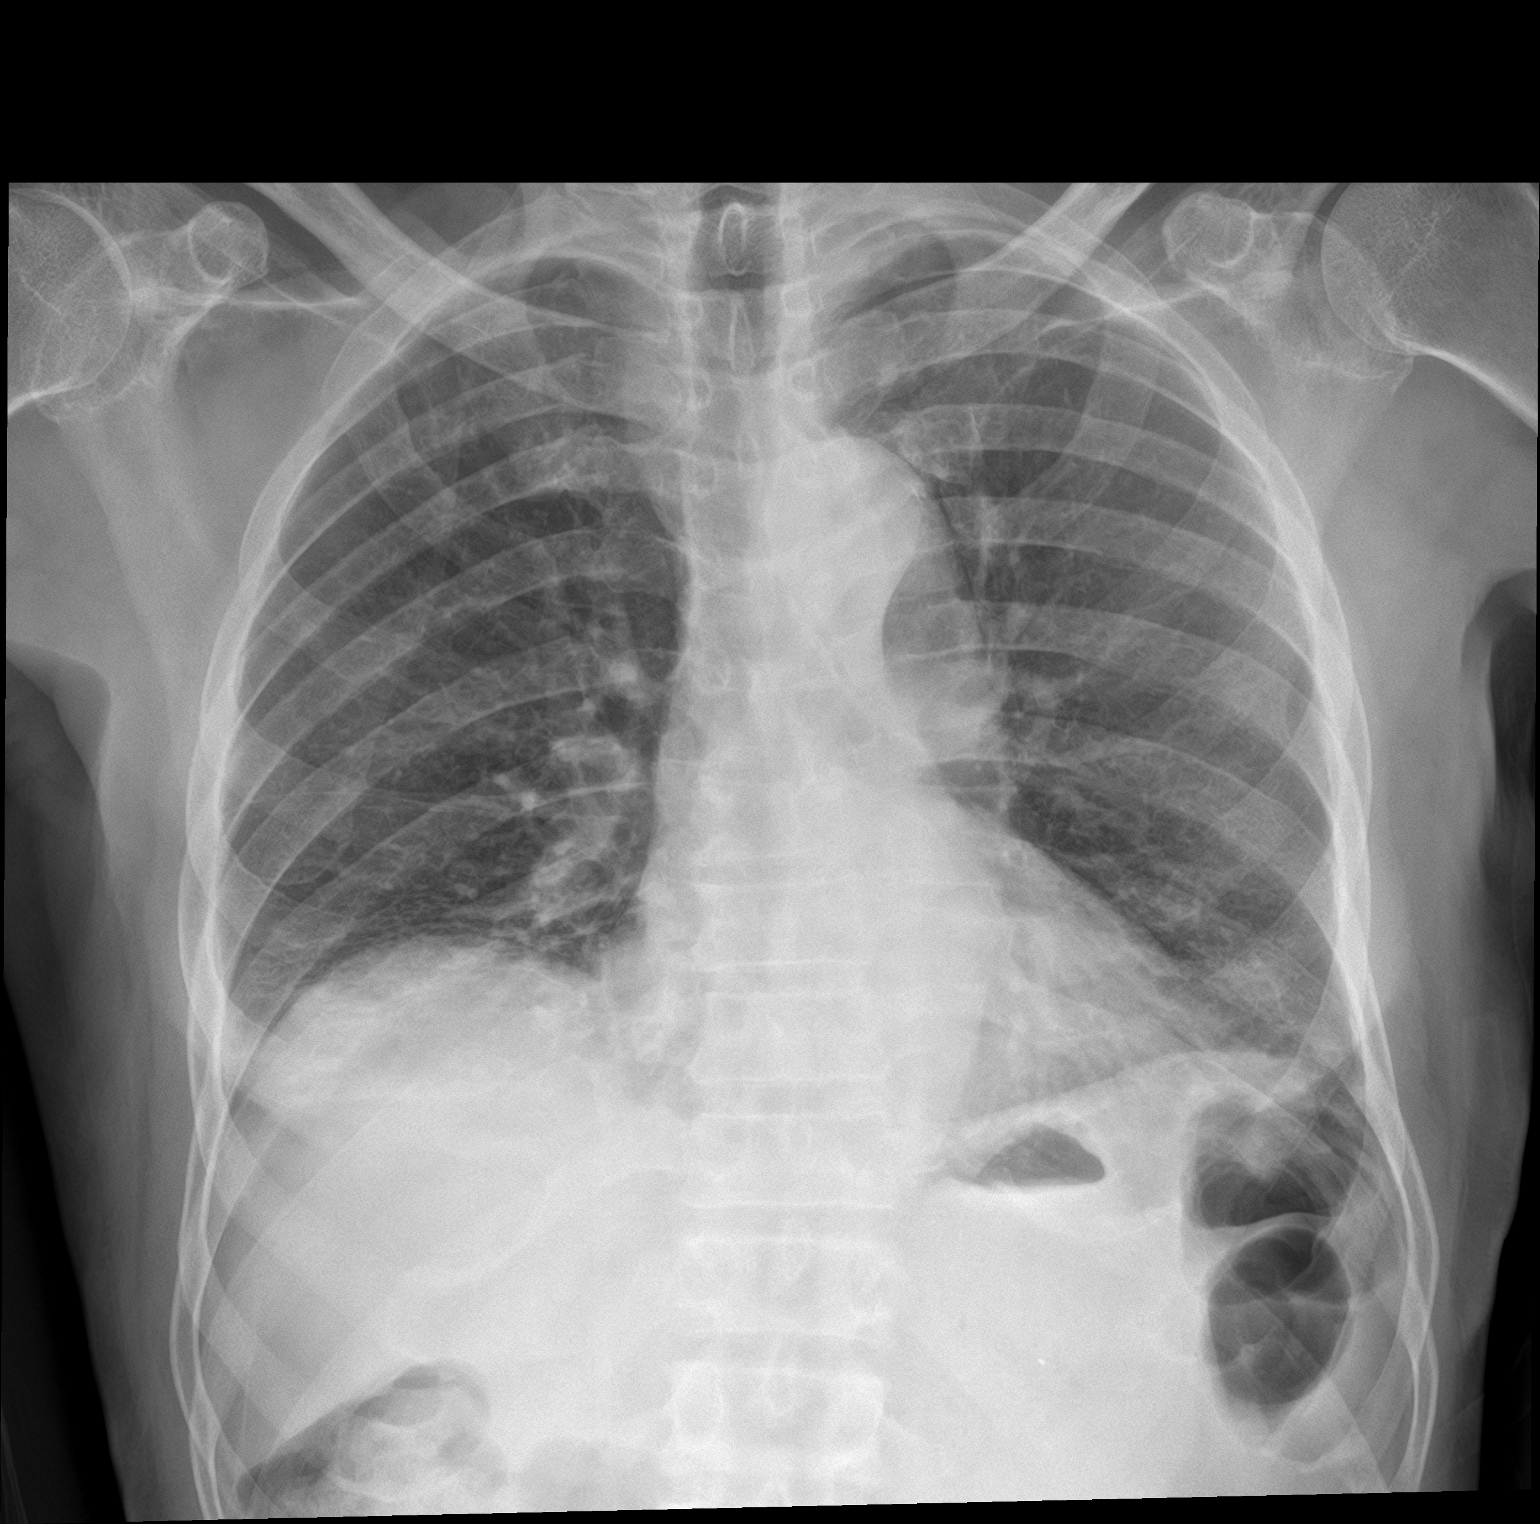

[chest lat]
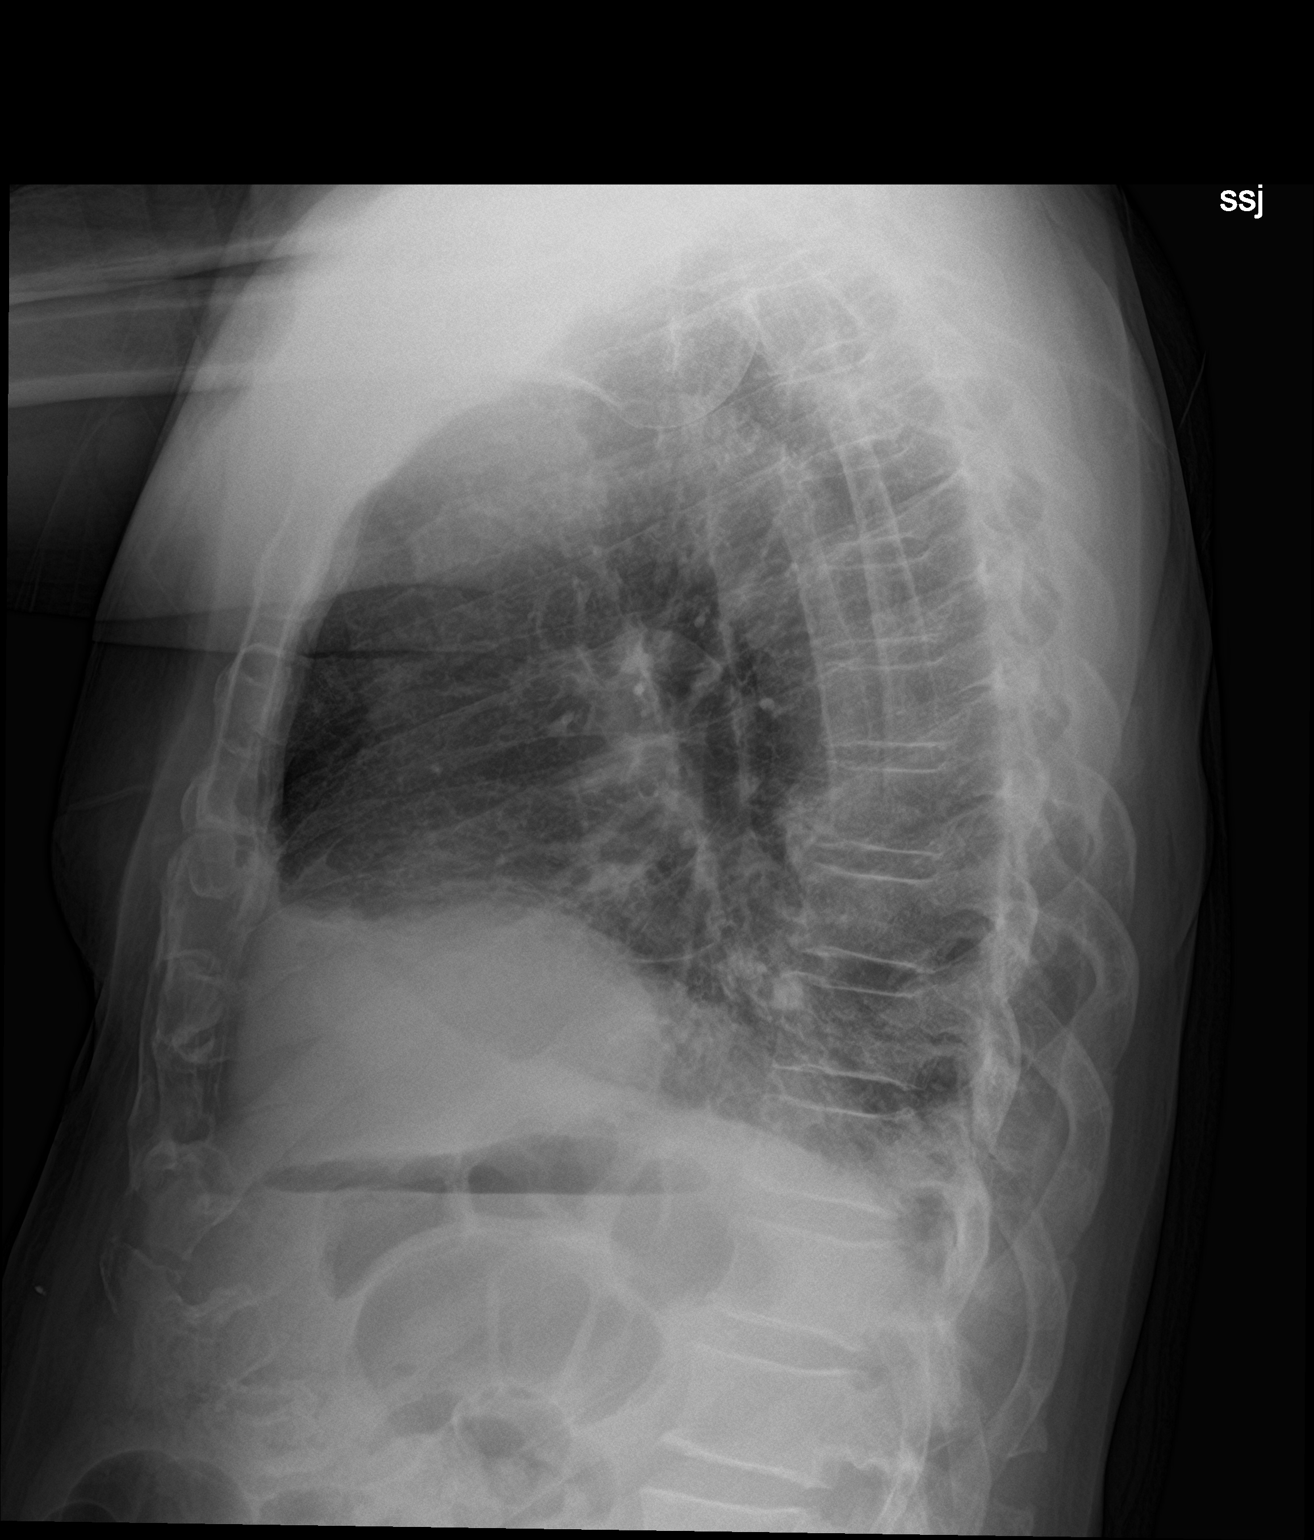

[2 of 2 positions shown; findings below may reference images not displayed]

FINDINGS: The heart size and mediastinal contours are within normal limits. No
pneumothorax or pleural effusion is noted. Mild bibasilar
subsegmental atelectasis is noted. The visualized skeletal
structures are unremarkable.
IMPRESSION: Mild bibasilar subsegmental atelectasis.

## 2021-02-15 DIAGNOSIS — N401 Enlarged prostate with lower urinary tract symptoms: Secondary | ICD-10-CM | POA: Diagnosis not present

## 2021-02-15 DIAGNOSIS — D400 Neoplasm of uncertain behavior of prostate: Secondary | ICD-10-CM | POA: Diagnosis not present

## 2021-02-15 DIAGNOSIS — R972 Elevated prostate specific antigen [PSA]: Secondary | ICD-10-CM | POA: Diagnosis not present

## 2021-03-17 DIAGNOSIS — R1032 Left lower quadrant pain: Secondary | ICD-10-CM | POA: Diagnosis not present

## 2021-04-27 DIAGNOSIS — H40153 Residual stage of open-angle glaucoma, bilateral: Secondary | ICD-10-CM | POA: Diagnosis not present

## 2021-06-16 DIAGNOSIS — I10 Essential (primary) hypertension: Secondary | ICD-10-CM | POA: Diagnosis not present

## 2021-06-16 DIAGNOSIS — E7800 Pure hypercholesterolemia, unspecified: Secondary | ICD-10-CM | POA: Diagnosis not present

## 2021-06-22 DIAGNOSIS — Z0000 Encounter for general adult medical examination without abnormal findings: Secondary | ICD-10-CM | POA: Diagnosis not present

## 2021-06-22 DIAGNOSIS — I10 Essential (primary) hypertension: Secondary | ICD-10-CM | POA: Diagnosis not present

## 2021-06-22 DIAGNOSIS — N289 Disorder of kidney and ureter, unspecified: Secondary | ICD-10-CM | POA: Diagnosis not present

## 2021-06-22 DIAGNOSIS — E7800 Pure hypercholesterolemia, unspecified: Secondary | ICD-10-CM | POA: Diagnosis not present

## 2021-06-22 DIAGNOSIS — Z1389 Encounter for screening for other disorder: Secondary | ICD-10-CM | POA: Diagnosis not present

## 2021-06-22 DIAGNOSIS — N400 Enlarged prostate without lower urinary tract symptoms: Secondary | ICD-10-CM | POA: Diagnosis not present

## 2021-12-21 DIAGNOSIS — H40153 Residual stage of open-angle glaucoma, bilateral: Secondary | ICD-10-CM | POA: Diagnosis not present

## 2022-01-05 DIAGNOSIS — I10 Essential (primary) hypertension: Secondary | ICD-10-CM | POA: Diagnosis not present

## 2022-01-11 DIAGNOSIS — N400 Enlarged prostate without lower urinary tract symptoms: Secondary | ICD-10-CM | POA: Diagnosis not present

## 2022-01-11 DIAGNOSIS — Z0000 Encounter for general adult medical examination without abnormal findings: Secondary | ICD-10-CM | POA: Diagnosis not present

## 2022-01-11 DIAGNOSIS — Z0001 Encounter for general adult medical examination with abnormal findings: Secondary | ICD-10-CM | POA: Diagnosis not present

## 2022-01-11 DIAGNOSIS — E7800 Pure hypercholesterolemia, unspecified: Secondary | ICD-10-CM | POA: Diagnosis not present

## 2022-01-11 DIAGNOSIS — I10 Essential (primary) hypertension: Secondary | ICD-10-CM | POA: Diagnosis not present

## 2022-01-11 DIAGNOSIS — N289 Disorder of kidney and ureter, unspecified: Secondary | ICD-10-CM | POA: Diagnosis not present

## 2022-02-23 DIAGNOSIS — D400 Neoplasm of uncertain behavior of prostate: Secondary | ICD-10-CM | POA: Diagnosis not present

## 2022-02-23 DIAGNOSIS — R972 Elevated prostate specific antigen [PSA]: Secondary | ICD-10-CM | POA: Diagnosis not present

## 2022-02-23 DIAGNOSIS — N401 Enlarged prostate with lower urinary tract symptoms: Secondary | ICD-10-CM | POA: Diagnosis not present

## 2022-03-19 DIAGNOSIS — N189 Chronic kidney disease, unspecified: Secondary | ICD-10-CM | POA: Diagnosis not present

## 2022-03-19 DIAGNOSIS — E7800 Pure hypercholesterolemia, unspecified: Secondary | ICD-10-CM | POA: Diagnosis not present

## 2022-03-19 DIAGNOSIS — R7989 Other specified abnormal findings of blood chemistry: Secondary | ICD-10-CM | POA: Diagnosis not present

## 2022-03-19 DIAGNOSIS — I071 Rheumatic tricuspid insufficiency: Secondary | ICD-10-CM | POA: Diagnosis not present

## 2022-03-19 DIAGNOSIS — N400 Enlarged prostate without lower urinary tract symptoms: Secondary | ICD-10-CM | POA: Diagnosis not present

## 2022-03-19 DIAGNOSIS — I700 Atherosclerosis of aorta: Secondary | ICD-10-CM | POA: Diagnosis not present

## 2022-03-19 DIAGNOSIS — R778 Other specified abnormalities of plasma proteins: Secondary | ICD-10-CM | POA: Diagnosis not present

## 2022-03-19 DIAGNOSIS — R0789 Other chest pain: Secondary | ICD-10-CM | POA: Diagnosis not present

## 2022-03-19 DIAGNOSIS — R109 Unspecified abdominal pain: Secondary | ICD-10-CM | POA: Diagnosis not present

## 2022-03-19 DIAGNOSIS — I129 Hypertensive chronic kidney disease with stage 1 through stage 4 chronic kidney disease, or unspecified chronic kidney disease: Secondary | ICD-10-CM | POA: Diagnosis not present

## 2022-03-19 DIAGNOSIS — I161 Hypertensive emergency: Secondary | ICD-10-CM | POA: Diagnosis not present

## 2022-03-19 DIAGNOSIS — I214 Non-ST elevation (NSTEMI) myocardial infarction: Secondary | ICD-10-CM | POA: Diagnosis not present

## 2022-03-19 DIAGNOSIS — R079 Chest pain, unspecified: Secondary | ICD-10-CM | POA: Diagnosis not present

## 2022-03-19 DIAGNOSIS — I2510 Atherosclerotic heart disease of native coronary artery without angina pectoris: Secondary | ICD-10-CM | POA: Diagnosis not present

## 2022-03-19 DIAGNOSIS — E876 Hypokalemia: Secondary | ICD-10-CM | POA: Diagnosis not present

## 2022-03-19 DIAGNOSIS — I1310 Hypertensive heart and chronic kidney disease without heart failure, with stage 1 through stage 4 chronic kidney disease, or unspecified chronic kidney disease: Secondary | ICD-10-CM | POA: Diagnosis not present

## 2022-03-20 DIAGNOSIS — I214 Non-ST elevation (NSTEMI) myocardial infarction: Secondary | ICD-10-CM | POA: Diagnosis not present

## 2022-03-21 DIAGNOSIS — I2510 Atherosclerotic heart disease of native coronary artery without angina pectoris: Secondary | ICD-10-CM | POA: Diagnosis not present

## 2022-03-23 DIAGNOSIS — I214 Non-ST elevation (NSTEMI) myocardial infarction: Secondary | ICD-10-CM | POA: Diagnosis not present

## 2022-03-25 DIAGNOSIS — R1310 Dysphagia, unspecified: Secondary | ICD-10-CM | POA: Diagnosis not present

## 2022-03-25 DIAGNOSIS — I2510 Atherosclerotic heart disease of native coronary artery without angina pectoris: Secondary | ICD-10-CM | POA: Diagnosis not present

## 2022-03-25 DIAGNOSIS — Z09 Encounter for follow-up examination after completed treatment for conditions other than malignant neoplasm: Secondary | ICD-10-CM | POA: Diagnosis not present

## 2022-03-29 ENCOUNTER — Other Ambulatory Visit: Payer: Self-pay | Admitting: Internal Medicine

## 2022-03-29 DIAGNOSIS — R1310 Dysphagia, unspecified: Secondary | ICD-10-CM

## 2022-04-08 ENCOUNTER — Ambulatory Visit
Admission: RE | Admit: 2022-04-08 | Discharge: 2022-04-08 | Disposition: A | Discharge status: Home / Self Care | Payer: Medicare HMO | Source: Ambulatory Visit | Attending: Internal Medicine | Admitting: Internal Medicine

## 2022-04-08 DIAGNOSIS — R1310 Dysphagia, unspecified: Secondary | ICD-10-CM | POA: Diagnosis not present

## 2022-04-08 NOTE — Progress Notes (Signed)
Modified Barium Swallow Progress Note  Patient Details  Name: Jakeb MERIC JOYE MRN: [id number] Date of Birth: 1934-03-09  Today's Date: 04/08/2022  Modified Barium Swallow completed.  Full report located under Chart Review in the Imaging Section.  Brief recommendations include the following:  Clinical Impression  Pt presents with adequate oropharyngeal abilities with consuming thin liquids via cup, puree and graham crackers. Education provided on using liquid wash to decrease globus sensation when consuming chicken and beef. At this time, skilled ST intervention is not indicated.   Swallow Evaluation Recommendations       SLP Diet Recommendations: Regular solids;Thin liquid   Liquid Administration via: Straw;Cup   Medication Administration: Whole meds with liquid   Supervision: Patient able to self feed   Compensations: Minimize environmental distractions;Slow rate;Small sips/bites   Postural Changes: Seated upright at 90 degrees   Oral Care Recommendations: Oral care BID       Mckynlee Luse B. Rutherford Nail, M.S., CCC-SLP, [place] Pathologist Certified Brain Injury Specialist [facility]  [facility] [phone] Ascom [phone] Fax [phone]  Brinn Westby Rutherford Nail 04/08/2022,3:03 PM

## 2022-04-11 DIAGNOSIS — I214 Non-ST elevation (NSTEMI) myocardial infarction: Secondary | ICD-10-CM | POA: Diagnosis not present

## 2022-04-11 DIAGNOSIS — E7800 Pure hypercholesterolemia, unspecified: Secondary | ICD-10-CM | POA: Diagnosis not present

## 2022-04-11 DIAGNOSIS — I10 Essential (primary) hypertension: Secondary | ICD-10-CM | POA: Diagnosis not present

## 2022-04-11 DIAGNOSIS — R001 Bradycardia, unspecified: Secondary | ICD-10-CM | POA: Diagnosis not present

## 2022-04-28 DIAGNOSIS — H40153 Residual stage of open-angle glaucoma, bilateral: Secondary | ICD-10-CM | POA: Diagnosis not present

## 2022-06-18 DIAGNOSIS — N400 Enlarged prostate without lower urinary tract symptoms: Secondary | ICD-10-CM | POA: Diagnosis not present

## 2022-06-18 DIAGNOSIS — I129 Hypertensive chronic kidney disease with stage 1 through stage 4 chronic kidney disease, or unspecified chronic kidney disease: Secondary | ICD-10-CM | POA: Diagnosis not present

## 2022-06-18 DIAGNOSIS — Z7902 Long term (current) use of antithrombotics/antiplatelets: Secondary | ICD-10-CM | POA: Diagnosis not present

## 2022-06-18 DIAGNOSIS — E785 Hyperlipidemia, unspecified: Secondary | ICD-10-CM | POA: Diagnosis not present

## 2022-06-18 DIAGNOSIS — I252 Old myocardial infarction: Secondary | ICD-10-CM | POA: Diagnosis not present

## 2022-06-18 DIAGNOSIS — Z7982 Long term (current) use of aspirin: Secondary | ICD-10-CM | POA: Diagnosis not present

## 2022-06-18 DIAGNOSIS — R197 Diarrhea, unspecified: Secondary | ICD-10-CM | POA: Diagnosis not present

## 2022-06-18 DIAGNOSIS — I2510 Atherosclerotic heart disease of native coronary artery without angina pectoris: Secondary | ICD-10-CM | POA: Diagnosis not present

## 2022-06-18 DIAGNOSIS — N189 Chronic kidney disease, unspecified: Secondary | ICD-10-CM | POA: Diagnosis not present

## 2022-07-07 DIAGNOSIS — I10 Essential (primary) hypertension: Secondary | ICD-10-CM | POA: Diagnosis not present

## 2022-07-14 DIAGNOSIS — N400 Enlarged prostate without lower urinary tract symptoms: Secondary | ICD-10-CM | POA: Diagnosis not present

## 2022-07-14 DIAGNOSIS — N183 Chronic kidney disease, stage 3 unspecified: Secondary | ICD-10-CM | POA: Diagnosis not present

## 2022-07-14 DIAGNOSIS — I129 Hypertensive chronic kidney disease with stage 1 through stage 4 chronic kidney disease, or unspecified chronic kidney disease: Secondary | ICD-10-CM | POA: Diagnosis not present

## 2022-07-14 DIAGNOSIS — E7800 Pure hypercholesterolemia, unspecified: Secondary | ICD-10-CM | POA: Diagnosis not present

## 2022-08-30 DIAGNOSIS — N401 Enlarged prostate with lower urinary tract symptoms: Secondary | ICD-10-CM | POA: Diagnosis not present

## 2022-08-30 DIAGNOSIS — Z79899 Other long term (current) drug therapy: Secondary | ICD-10-CM | POA: Diagnosis not present

## 2022-08-30 DIAGNOSIS — R972 Elevated prostate specific antigen [PSA]: Secondary | ICD-10-CM | POA: Diagnosis not present

## 2022-08-30 DIAGNOSIS — R9721 Rising PSA following treatment for malignant neoplasm of prostate: Secondary | ICD-10-CM | POA: Diagnosis not present

## 2022-08-30 DIAGNOSIS — D400 Neoplasm of uncertain behavior of prostate: Secondary | ICD-10-CM | POA: Diagnosis not present

## 2022-09-01 DIAGNOSIS — H40153 Residual stage of open-angle glaucoma, bilateral: Secondary | ICD-10-CM | POA: Diagnosis not present

## 2022-09-06 ENCOUNTER — Other Ambulatory Visit: Payer: Self-pay | Admitting: Physical Medicine and Rehabilitation

## 2022-09-06 ENCOUNTER — Other Ambulatory Visit: Payer: Self-pay | Admitting: Urology

## 2022-09-06 DIAGNOSIS — R972 Elevated prostate specific antigen [PSA]: Secondary | ICD-10-CM

## 2022-09-13 ENCOUNTER — Ambulatory Visit
Admission: RE | Admit: 2022-09-13 | Discharge: 2022-09-13 | Disposition: A | Discharge status: Home / Self Care | Payer: Medicare HMO | Source: Ambulatory Visit | Attending: Physical Medicine and Rehabilitation | Admitting: Physical Medicine and Rehabilitation

## 2022-09-13 DIAGNOSIS — N402 Nodular prostate without lower urinary tract symptoms: Secondary | ICD-10-CM | POA: Diagnosis not present

## 2022-09-13 DIAGNOSIS — R972 Elevated prostate specific antigen [PSA]: Secondary | ICD-10-CM | POA: Diagnosis not present

## 2022-09-13 DIAGNOSIS — N400 Enlarged prostate without lower urinary tract symptoms: Secondary | ICD-10-CM | POA: Diagnosis not present

## 2022-09-13 MED ORDER — GADOBUTROL 1 MMOL/ML IV SOLN
7.0000 mL | Freq: Once | INTRAVENOUS | Status: AC | PRN
  Administered 2022-09-13: 7 mL via INTRAVENOUS

## 2022-09-16 DIAGNOSIS — R9721 Rising PSA following treatment for malignant neoplasm of prostate: Secondary | ICD-10-CM | POA: Diagnosis not present

## 2022-09-16 DIAGNOSIS — D400 Neoplasm of uncertain behavior of prostate: Secondary | ICD-10-CM | POA: Diagnosis not present

## 2022-09-16 DIAGNOSIS — N401 Enlarged prostate with lower urinary tract symptoms: Secondary | ICD-10-CM | POA: Diagnosis not present

## 2022-11-03 ENCOUNTER — Ambulatory Visit
Admission: EM | Admit: 2022-11-03 | Discharge: 2022-11-03 | Disposition: A | Discharge status: Home / Self Care | Payer: Medicare HMO

## 2022-11-03 ENCOUNTER — Encounter: Payer: Self-pay | Admitting: Emergency Medicine

## 2022-11-03 DIAGNOSIS — J069 Acute upper respiratory infection, unspecified: Secondary | ICD-10-CM

## 2022-11-03 MED ORDER — IBUPROFEN 800 MG PO TABS: 800.0000 mg | ORAL_TABLET | Freq: Three times a day (TID) | ORAL | 0 refills | Status: AC

## 2022-11-03 MED ORDER — BENZONATATE 100 MG PO CAPS: 100.0000 mg | ORAL_CAPSULE | Freq: Three times a day (TID) | ORAL | 0 refills | Status: AC

## 2022-11-03 MED ORDER — AZITHROMYCIN 250 MG PO TABS: 250.0000 mg | ORAL_TABLET | Freq: Every day | ORAL | 0 refills | Status: AC

## 2022-11-03 NOTE — ED Triage Notes (Signed)
Pt presents with a cough and headache x 2 days.

## 2022-11-03 NOTE — Discharge Instructions (Signed)
Your symptoms today are most likely being caused by a virus and should steadily improve in time it can take up to 7 to 10 days before you truly start to see a turnaround however things will get better    You have been given the antibiotic to be used prophylactically for coverage for bacteria, take azithromycin as directed  You may take Tessalon pill every 8 hours to help calm your coughing  You may use ibuprofen every 8 hours for management of your headaches, you may take Tylenol 325 mg every 6 hours in addition to this     For cough: honey 1/2 to 1 teaspoon (you can dilute the honey in water or another fluid).  You can also use guaifenesin and dextromethorphan for cough. You can use a humidifier for chest congestion and cough.  If you don't have a humidifier, you can sit in the bathroom with the hot shower running.      For sore throat: try warm salt water gargles, cepacol lozenges, throat spray, warm tea or water with lemon/honey, popsicles or ice, or OTC cold relief medicine for throat discomfort.   For congestion: take a daily anti-histamine like Zyrtec, Claritin, and a oral decongestant, such as pseudoephedrine.  You can also use Flonase 1-2 sprays in each nostril daily.   It is important to stay hydrated: drink plenty of fluids (water, gatorade/powerade/pedialyte, juices, or teas) to keep your throat moisturized and help further relieve irritation/discomfort.

## 2022-11-03 NOTE — ED Provider Notes (Signed)
[facility]    CSN: [id number] Arrival date & time: 11/03/22  7026      History   Chief Complaint Chief Complaint  Patient presents with   Cough   Headache    HPI Kaiyden B Mcenery is a 88 y.o. male.   Patient presents for evaluation of the intermittent productive and dry cough and intermittent headaches present for 2 days.  No known sick contacts.  Tolerating food and liquids.  Has attempted use of Sudafed which has been somewhat helpful.  Denies fever, chills, body aches, nasal congestion, ear pain, sore throat, shortness of breath or wheezing.  History of hypertension.   Past Medical History:  Diagnosis Date   Colon cancer ([place])    High cholesterol    Hypertension     There are no problems to display for this patient.   Past Surgical History:  Procedure Laterality Date   COLON SURGERY         Home Medications    Prior to Admission medications   Medication Sig Start Date End Date Taking? Authorizing Provider  aspirin 81 MG chewable tablet Chew by mouth. 03/22/22  Yes [provider]  atorvastatin (LIPITOR) 80 MG tablet Take by mouth. 03/22/22  Yes [provider]  clopidogrel (PLAVIX) 75 MG tablet Take by mouth. 03/22/22  Yes [provider]  losartan (COZAAR) 50 MG tablet Take 1 tablet by mouth daily. 03/23/22  Yes [provider]  metoprolol succinate (TOPROL-XL) 25 MG 24 hr tablet Take 1 tablet by mouth daily. 04/12/22  Yes [provider]  tadalafil (CIALIS) 10 MG tablet Take by mouth. 07/14/22  Yes [provider]  bicalutamide (CASODEX) 50 MG tablet Take 50 mg by mouth daily. 12/24/17   [provider]  finasteride (PROSCAR) 5 MG tablet Take 5 mg by mouth daily.    [provider]  hydrochlorothiazide (HYDRODIURIL) 25 MG tablet Take 25 mg by mouth daily. 09/13/18   [provider]  Menthol-Camphor (ICY HOT ADVANCED RELIEF) 16-11 % CREA Apply 1 application topically 2 (two)  times daily as needed (pain). 12/05/18   Orbie Pyo, MD  Multiple Vitamin (MULTI-VITAMIN) tablet Take 1 tablet by mouth daily.    [provider]  naproxen (NAPROSYN) 375 MG tablet Take 1 tablet (375 mg total) by mouth 2 (two) times daily with a meal. 12/05/18   Schaevitz, Randall An, MD    Family History History reviewed. No pertinent family history.  Social History Social History   Tobacco Use   Smoking status: Former   Smokeless tobacco: Never  Scientific laboratory technician Use: Never used  Substance Use Topics   Alcohol use: Never   Drug use: Never     Allergies   Patient has no known allergies.   Review of Systems Review of Systems  Constitutional: Negative.   HENT: Negative.    Respiratory:  Positive for cough. Negative for apnea, choking, chest tightness, shortness of breath, wheezing and stridor.   Cardiovascular: Negative.   Gastrointestinal: Negative.   Skin: Negative.   Neurological:  Positive for headaches. Negative for dizziness, tremors, seizures, syncope, facial asymmetry, speech difficulty, weakness, light-headedness and numbness.     Physical Exam Triage Vital Signs ED Triage Vitals  Enc Vitals Group     BP 11/03/22 0902 (!) 185/79     Pulse Rate 11/03/22 0902 69     Resp 11/03/22 0902 16     Temp 11/03/22 0902 99.9 F (37.7 C)  Temp Source 11/03/22 0902 Oral     SpO2 11/03/22 0902 97 %     Weight --      Height --      Head Circumference --      Peak Flow --      Pain Score 11/03/22 0900 0     Pain Loc --      Pain Edu? --      Excl. in [place]? --    No data found.  Updated Vital Signs BP (!) 185/79 (BP Location: Left Arm)   Pulse 69   Temp 99.9 F (37.7 C) (Oral)   Resp 16   SpO2 97%   Visual Acuity Right Eye Distance:   Left Eye Distance:   Bilateral Distance:    Right Eye Near:   Left Eye Near:    Bilateral Near:     Physical Exam Constitutional:      Appearance: Normal appearance.  HENT:     Right Ear:  Tympanic membrane, ear canal and external ear normal.     Left Ear: Tympanic membrane, ear canal and external ear normal.     Nose: Nose normal.     Mouth/Throat:     Mouth: Mucous membranes are moist.     Pharynx: Oropharynx is clear.  Eyes:     Extraocular Movements: Extraocular movements intact.  Cardiovascular:     Rate and Rhythm: Normal rate and regular rhythm.     Pulses: Normal pulses.     Heart sounds: Normal heart sounds.  Pulmonary:     Effort: Pulmonary effort is normal.     Breath sounds: Normal breath sounds.  Skin:    General: Skin is warm and dry.  Neurological:     Mental Status: He is alert and oriented to person, place, and time. Mental status is at baseline.  Psychiatric:        Mood and Affect: Mood normal.        Behavior: Behavior normal.      UC Treatments / Results  Labs (all labs ordered are listed, but only abnormal results are displayed) Labs Reviewed - No data to display  EKG   Radiology No results found.  Procedures Procedures (including critical care time)  Medications Ordered in UC Medications - No data to display  Initial Impression / Assessment and Plan / UC Course  I have reviewed the triage vital signs and the nursing notes.  Pertinent labs & imaging results that were available during my care of the patient were reviewed by me and considered in my medical decision making (see chart for details).  Acute upper respiratory infection  Low-grade fever of 99.9 present in triage, patient is in no signs of distress nontoxic-appearing, due to age and history we will prophylactically provide bacterial coverage, Z-Pak prescribed as well as ibuprofen 800 mg and Tessalon for management of cough and headache, may use additional over-the-counter medications as needed for management with urgent care follow-up for reevaluation as needed Final Clinical Impressions(s) / UC Diagnoses   Final diagnoses:  None   Discharge Instructions   None     ED Prescriptions   None    PDMP not reviewed this encounter.   Hans Eden, [place] 11/03/22 [phone]

## 2023-01-05 DIAGNOSIS — H40153 Residual stage of open-angle glaucoma, bilateral: Secondary | ICD-10-CM | POA: Diagnosis not present

## 2023-01-11 DIAGNOSIS — I10 Essential (primary) hypertension: Secondary | ICD-10-CM | POA: Diagnosis not present

## 2023-01-18 DIAGNOSIS — N289 Disorder of kidney and ureter, unspecified: Secondary | ICD-10-CM | POA: Diagnosis not present

## 2023-01-18 DIAGNOSIS — I10 Essential (primary) hypertension: Secondary | ICD-10-CM | POA: Diagnosis not present

## 2023-01-18 DIAGNOSIS — C61 Malignant neoplasm of prostate: Secondary | ICD-10-CM | POA: Diagnosis not present

## 2023-01-18 DIAGNOSIS — Z1331 Encounter for screening for depression: Secondary | ICD-10-CM | POA: Diagnosis not present

## 2023-01-18 DIAGNOSIS — Z0001 Encounter for general adult medical examination with abnormal findings: Secondary | ICD-10-CM | POA: Diagnosis not present

## 2023-01-18 DIAGNOSIS — Z0000 Encounter for general adult medical examination without abnormal findings: Secondary | ICD-10-CM | POA: Diagnosis not present

## 2023-01-18 DIAGNOSIS — N1831 Chronic kidney disease, stage 3a: Secondary | ICD-10-CM | POA: Insufficient documentation

## 2023-01-18 DIAGNOSIS — E7800 Pure hypercholesterolemia, unspecified: Secondary | ICD-10-CM | POA: Diagnosis not present

## 2023-05-04 DIAGNOSIS — H40153 Residual stage of open-angle glaucoma, bilateral: Secondary | ICD-10-CM | POA: Diagnosis not present

## 2023-07-21 DIAGNOSIS — I10 Essential (primary) hypertension: Secondary | ICD-10-CM | POA: Diagnosis not present

## 2023-10-03 DIAGNOSIS — H524 Presbyopia: Secondary | ICD-10-CM | POA: Diagnosis not present

## 2023-10-03 DIAGNOSIS — H40153 Residual stage of open-angle glaucoma, bilateral: Secondary | ICD-10-CM | POA: Diagnosis not present

## 2023-11-06 ENCOUNTER — Ambulatory Visit
Admission: EM | Admit: 2023-11-06 | Discharge: 2023-11-06 | Payer: Medicare HMO | Attending: Emergency Medicine | Admitting: Emergency Medicine

## 2023-11-06 DIAGNOSIS — E785 Hyperlipidemia, unspecified: Secondary | ICD-10-CM | POA: Diagnosis not present

## 2023-11-06 DIAGNOSIS — C641 Malignant neoplasm of right kidney, except renal pelvis: Secondary | ICD-10-CM | POA: Diagnosis not present

## 2023-11-06 DIAGNOSIS — N400 Enlarged prostate without lower urinary tract symptoms: Secondary | ICD-10-CM | POA: Diagnosis not present

## 2023-11-06 DIAGNOSIS — Z7982 Long term (current) use of aspirin: Secondary | ICD-10-CM | POA: Diagnosis not present

## 2023-11-06 DIAGNOSIS — I10 Essential (primary) hypertension: Secondary | ICD-10-CM | POA: Diagnosis not present

## 2023-11-06 DIAGNOSIS — I160 Hypertensive urgency: Secondary | ICD-10-CM

## 2023-11-06 DIAGNOSIS — M545 Low back pain, unspecified: Secondary | ICD-10-CM | POA: Diagnosis not present

## 2023-11-06 DIAGNOSIS — N189 Chronic kidney disease, unspecified: Secondary | ICD-10-CM | POA: Diagnosis not present

## 2023-11-06 DIAGNOSIS — I129 Hypertensive chronic kidney disease with stage 1 through stage 4 chronic kidney disease, or unspecified chronic kidney disease: Secondary | ICD-10-CM | POA: Diagnosis not present

## 2023-11-06 DIAGNOSIS — R932 Abnormal findings on diagnostic imaging of liver and biliary tract: Secondary | ICD-10-CM | POA: Diagnosis not present

## 2023-11-06 DIAGNOSIS — I714 Abdominal aortic aneurysm, without rupture, unspecified: Secondary | ICD-10-CM | POA: Diagnosis not present

## 2023-11-06 DIAGNOSIS — M5442 Lumbago with sciatica, left side: Secondary | ICD-10-CM | POA: Diagnosis not present

## 2023-11-06 DIAGNOSIS — N2889 Other specified disorders of kidney and ureter: Secondary | ICD-10-CM | POA: Diagnosis not present

## 2023-11-06 DIAGNOSIS — Z79899 Other long term (current) drug therapy: Secondary | ICD-10-CM | POA: Diagnosis not present

## 2023-11-06 NOTE — ED Provider Notes (Signed)
HPI  SUBJECTIVE:  Gabriel Thompson is a 89 y.o. male who presents with 3 days of midline upper lumbar pain after pushing a car 4 days ago.  It is intermittent, present primarily with movement and large positional changes.  No direct trauma to the back, abdominal pain, saddle anesthesia, radicular pain, leg weakness/numbness/tingling, urinary complaints, seizures, syncope, fevers.  He has never had back pain like this before.  He has taken Tylenol 650 mg once or twice a day with minimal improvement in his symptoms, and has also been wearing a back brace with improvement.  His back pain is better with sitting still, worse with large positional changes, torso rotation, and with bending forward.    He also has not taken his blood pressure medications which include metoprolol, losartan and hydrochlorothiazide in 3 days.  He states he is normally compliant with his medications and states that he has enough at home.  He has not checked his blood pressure at home over the past few days.  He states his blood pressure usually runs in the 140s. Patient has a past medical history of prostate cancer, hypercholesterolemia, hypertension, NSTEMI on Plavix.  No history of arrhythmia.  He denies a history of colon cancer/abdominal surgery.  Additional history obtained from daughter.  Past Medical History:  Diagnosis Date   Colon cancer (HCC)    High cholesterol    Hypertension     Past Surgical History:  Procedure Laterality Date   COLON SURGERY      History reviewed. No pertinent family history.  Social History   Tobacco Use   Smoking status: Former   Smokeless tobacco: Never  Advertising account planner   Vaping status: Never Used  Substance Use Topics   Alcohol use: Never   Drug use: Never    No current facility-administered medications for this encounter.  Current Outpatient Medications:    aspirin 81 MG chewable tablet, Chew by mouth., Disp: , Rfl:    atorvastatin (LIPITOR) 80 MG tablet, Take by mouth.,  Disp: , Rfl:    bicalutamide (CASODEX) 50 MG tablet, Take 50 mg by mouth daily., Disp: , Rfl:    clopidogrel (PLAVIX) 75 MG tablet, Take by mouth., Disp: , Rfl:    finasteride (PROSCAR) 5 MG tablet, Take 5 mg by mouth daily., Disp: , Rfl:    hydrochlorothiazide (HYDRODIURIL) 25 MG tablet, Take 25 mg by mouth daily., Disp: , Rfl:    ibuprofen (ADVIL) 800 MG tablet, Take 1 tablet (800 mg total) by mouth 3 (three) times daily., Disp: 21 tablet, Rfl: 0   losartan (COZAAR) 50 MG tablet, Take 1 tablet by mouth daily., Disp: , Rfl:    Menthol-Camphor (ICY HOT ADVANCED RELIEF) 16-11 % CREA, Apply 1 application topically 2 (two) times daily as needed (pain)., Disp: 56 g, Rfl: 0   metoprolol succinate (TOPROL-XL) 25 MG 24 hr tablet, Take 1 tablet by mouth daily., Disp: , Rfl:    Multiple Vitamin (MULTI-VITAMIN) tablet, Take 1 tablet by mouth daily., Disp: , Rfl:    naproxen (NAPROSYN) 375 MG tablet, Take 1 tablet (375 mg total) by mouth 2 (two) times daily with a meal., Disp: 10 tablet, Rfl: 0   tadalafil (CIALIS) 10 MG tablet, Take by mouth., Disp: , Rfl:    azithromycin (ZITHROMAX) 250 MG tablet, Take 1 tablet (250 mg total) by mouth daily. Take first 2 tablets together, then 1 every day until finished., Disp: 6 tablet, Rfl: 0   benzonatate (TESSALON) 100 MG capsule, Take 1 capsule (  100 mg total) by mouth every 8 (eight) hours., Disp: 21 capsule, Rfl: 0  No Known Allergies   ROS  As noted in HPI.   Physical Exam  BP (!) 234/93 (BP Location: Left Arm) Comment: didnt take BP meds  Pulse (!) 52   Temp 98 F (36.7 C) (Oral)   Resp 17   SpO2 100%    BP Readings from Last 3 Encounters:  11/06/23 (!) 234/93  11/03/22 (!) 185/79  12/05/18 (!) 180/90    Constitutional: Well developed, well nourished, no acute distress Eyes:  EOMI, conjunctiva normal bilaterally HENT: Normocephalic, atraumatic,mucus membranes moist Respiratory: Normal inspiratory effort Cardiovascular: Normal rate GI:  nondistended, soft, nontender, no abdominal bruit.  No palpable pulsatile masses. Back: No CVAT no paralumbar tenderness.  No muscle spasm. No L spine, sacral, SI joint bony tenderness.  Bilateral lower extremities nontender without edema, baseline ROM with intact PT pulses, pain with right hip flexion against resistance, left hip extension against resistance.  No pain with int/ext rotation, abduction/adduction hips bilaterally. SLR neg bilaterally. Sensation intact to light touch bilaterally over both legs, DTR's symmetric and intact bilaterally KJ , Motor symmetric bilateral 5/5 hip flexion, quadriceps, hamstrings, EHL, foot dorsiflexion, foot plantarflexion, pain aggravated with large positional changes. skin: No rash, skin intact Musculoskeletal: no deformities Neurologic: Alert & oriented x 3, no focal neuro deficits Psychiatric: Speech and behavior appropriate   ED Course   Medications - No data to display  No orders of the defined types were placed in this encounter.   No results found for this or any previous visit (from the past 24 hours). No results found.  ED Clinical Impression  1. Hypertensive urgency   2. Acute midline low back pain without sciatica      ED Assessment/Plan     Outside records reviewed.  Additional medical history obtained.   1.  Back pain.  Suspect musculoskeletal strain as it is aggravated with movement although he has no bony or muscular tenderness.  Aortic abdominal aneurysm in the differential given his history of high blood pressure, but I think that this is less likely.  2. hypertensive urgency.  Repeat blood pressure within appropriate sized cuff 270/114.  He is otherwise asymptomatic, however, we do not have any appropriate medications to lower his blood pressure here.  I am concerned that he may have secondary endorgan damage, although there is no evidence of stroke at this time.  He has no chest pain, shortness of breath, lower extremity  edema.  Transferring to the emergency department for further evaluation and possible blood pressure reduction.  Discussed rationale for transfer to the emergency department with the patient's daughter.  She has opted to go to the [facility] emergency department.  He is stable to go via private vehicle.   No orders of the defined types were placed in this encounter.     *This clinic note was created using Dragon dictation software. Therefore, there may be occasional mistakes despite careful proofreading.  ?    Domenick Gong, MD 11/06/23 1750

## 2023-11-06 NOTE — ED Notes (Signed)
Patient is being discharged from the Urgent Care and sent to the Emergency Department via PV . Per Domenick Gong, MD , patient is in need of higher level of care due to Hypertensive emergency and back pain. Patient is aware and verbalizes understanding of plan of care.  Vitals:   11/06/23 1609  BP: (!) 234/93  Pulse: (!) 52  Resp: 17  Temp: 98 F (36.7 C)  SpO2: 100%

## 2023-11-06 NOTE — Discharge Instructions (Signed)
Your father's blood pressure is significantly elevated here at 270/114.   While he is asymptomatic, and we have a reason for his elevated blood pressure, I am concerned that his elevated blood pressure could be causing secondary damage to his kidneys, heart.  I think the likelihood of any aortic abdominal aneurysm is low.  I am sending him to the emergency department for further evaluation and possible blood pressure reduction.  Let them know if he starts having chest pain, pressure, heaviness, worsening of his back pain, abdominal pain, severe headache, blurry or double vision, slurred speech, facial droop, arm/leg/facial numbness, tingling or weakness.

## 2023-11-06 NOTE — ED Triage Notes (Signed)
Patient hurt his back pushing a car Friday.

## 2023-11-07 DIAGNOSIS — I10 Essential (primary) hypertension: Secondary | ICD-10-CM | POA: Diagnosis not present

## 2023-11-10 DIAGNOSIS — N2889 Other specified disorders of kidney and ureter: Secondary | ICD-10-CM | POA: Diagnosis not present

## 2023-11-10 DIAGNOSIS — Z23 Encounter for immunization: Secondary | ICD-10-CM | POA: Diagnosis not present

## 2023-11-10 DIAGNOSIS — I7143 Infrarenal abdominal aortic aneurysm, without rupture: Secondary | ICD-10-CM | POA: Diagnosis not present

## 2023-11-10 DIAGNOSIS — I10 Essential (primary) hypertension: Secondary | ICD-10-CM | POA: Diagnosis not present

## 2023-12-04 ENCOUNTER — Ambulatory Visit (INDEPENDENT_AMBULATORY_CARE_PROVIDER_SITE_OTHER): Payer: Medicare HMO | Admitting: Nurse Practitioner

## 2023-12-04 ENCOUNTER — Encounter (INDEPENDENT_AMBULATORY_CARE_PROVIDER_SITE_OTHER): Payer: Self-pay | Admitting: Nurse Practitioner

## 2023-12-04 VITALS — BP 159/75 | HR 54 | Resp 18 | Ht 66.0 in | Wt 141.8 lb

## 2023-12-04 DIAGNOSIS — I7143 Infrarenal abdominal aortic aneurysm, without rupture: Secondary | ICD-10-CM

## 2023-12-04 DIAGNOSIS — E785 Hyperlipidemia, unspecified: Secondary | ICD-10-CM

## 2023-12-04 DIAGNOSIS — I10 Essential (primary) hypertension: Secondary | ICD-10-CM | POA: Diagnosis not present

## 2023-12-04 NOTE — Progress Notes (Signed)
Subjective:    Patient ID: Gabriel Thompson, male    DOB: [birth year], 89 y.o.   MRN: [id number] Chief Complaint  Patient presents with   New Patient (Initial Visit)    Specialty Services Required  NP. consult. 3.6cm AAA on CTA from 10/2023 in Care Everywhere. [email or web address].    The patient presents to the office for evaluation of an abdominal aortic aneurysm. The aneurysm was found incidentally by CT scan. Patient denies abdominal pain or unusual back pain, no other abdominal complaints.  No history of an abrupt onset of a painful toe associated with blue discoloration.     No family history of AAA.   Patient denies amaurosis fugax or TIA symptoms. There is no history of claudication or rest pain symptoms of the lower extremities.  The patient denies angina or shortness of breath.  CT scan dated 11/05/2024 shows an AAA that measures 3.5x3.6 cm and additionally a 1.5 cm of the right common iliac artery was also detected.    Review of Systems  Musculoskeletal:  Negative for joint swelling.  All other systems reviewed and are negative.      Objective:   Physical Exam  BP (!) 159/75   Pulse (!) 54   Resp 18   Ht 5\' 6"  (1.676 m)   Wt 141 lb 12.8 oz (64.3 kg)   BMI 22.89 kg/m   Past Medical History:  Diagnosis Date   Colon cancer (HCC)    High cholesterol    Hypertension     Social History   Socioeconomic History   Marital status: Married    Spouse name: Not on file   Number of children: Not on file   Years of education: Not on file   Highest education level: Not on file  Occupational History   Not on file  Tobacco Use   Smoking status: Former   Smokeless tobacco: Never  Vaping Use   Vaping status: Never Used  Substance and Sexual Activity   Alcohol use: Never   Drug use: Never   Sexual activity: Not on file  Other Topics Concern   Not on file  Social History Narrative   Not on file   Social Drivers of Health   Financial Resource Strain: Low Risk  (03/20/2022)    Received from [facility], [facility]   Overall Financial Resource Strain (CARDIA)    Difficulty of Paying Living Expenses: Not very hard  Food Insecurity: No Food Insecurity (03/20/2022)   Received from [facility], [facility]   Hunger Vital Sign    Worried About Running Out of Food in the Last Year: Never true    Ran Out of Food in the Last Year: Never true  Transportation Needs: No Transportation Needs (03/20/2022)   Received from [facility], [facility]   PRAPARE - Transportation    Lack of Transportation (Medical): No    Lack of Transportation (Non-Medical): No  Physical Activity: Not on file  Stress: Not on file  Social Connections: Not on file  Intimate Partner Violence: Not on file    Past Surgical History:  Procedure Laterality Date   COLON SURGERY      History reviewed. No pertinent family history.  No Known Allergies     Latest Ref Rng & Units 12/05/2018    2:24 PM 07/11/2012    2:20 PM  CBC  WBC 4.0 - 10.5 K/uL 9.5    Hemoglobin 13.0 - 17.0 g/dL 14.1  13.9   Hematocrit 39.0 - 52.0 % 44.6    Platelets 150 - 400 K/uL 229        CMP     Component Value Date/Time   NA 140 12/05/2018 1506   K 4.3 12/05/2018 1506   CL 104 12/05/2018 1506   CO2 29 12/05/2018 1506   GLUCOSE 151 (H) 12/05/2018 1506   BUN 17 12/05/2018 1506   CREATININE 1.40 (H) 01/18/2019 1450   CALCIUM 9.4 12/05/2018 1506   GFRNONAA 49 (L) 12/05/2018 1506     No results found.     Assessment & Plan:   1. Infrarenal abdominal aortic aneurysm (AAA) without rupture (HCC) (Primary) Recommend: No surgery or intervention is indicated at this time.  The patient has an asymptomatic abdominal aortic aneurysm that is less than 4 cm in maximal diameter.    I have reviewed the natural history of abdominal aortic aneurysm and the small risk of rupture for aneurysm less than 5 cm in size.  However, as these small aneurysms tend to enlarge over time, continued  surveillance with ultrasound or CT scan is mandatory.   I have also discussed optimizing medical management with hypertension and lipid control and the negative effect that any tobacco products have on aneurysmal disease.  The patient is also encouraged to exercise a minimum of 30 minutes 4 times a week.   Should the patient develop new onset abdominal or back pain or signs of peripheral embolization they are instructed to seek medical attention immediately and to alert the physician providing care that they have an aneurysm.   The patient voices their understanding.  Because this is the initial evaluation we will have the patient return in 6 months to ensure there has not been rapid growth.  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 Hyperlipidemia, unspecified hyperlipidemia type Continue statin as ordered and reviewed, no changes at this time  3. Hypertension, unspecified type Continue antihypertensive medications as already ordered, these medications have been reviewed and there are no changes at this time.   Current Outpatient Medications on File Prior to Visit  Medication Sig Dispense Refill   amLODipine (NORVASC) 10 MG tablet Take 1 tablet by mouth daily.     aspirin 81 MG chewable tablet Chew by mouth.     atorvastatin (LIPITOR) 80 MG tablet Take by mouth.     azithromycin (ZITHROMAX) 250 MG tablet Take 1 tablet (250 mg total) by mouth daily. Take first 2 tablets together, then 1 every day until finished. 6 tablet 0   benzonatate (TESSALON) 100 MG capsule Take 1 capsule (100 mg total) by mouth every 8 (eight) hours. 21 capsule 0   bicalutamide (CASODEX) 50 MG tablet Take 50 mg by mouth daily.     clopidogrel (PLAVIX) 75 MG tablet Take by mouth.     finasteride (PROSCAR) 5 MG tablet Take 5 mg by mouth daily.     hydrochlorothiazide (HYDRODIURIL) 25 MG tablet Take 25 mg by mouth daily.     ibuprofen (ADVIL) 800 MG tablet Take 1 tablet (800 mg total) by  mouth 3 (three) times daily. 21 tablet 0   losartan (COZAAR) 50 MG tablet Take 1 tablet by mouth daily.     Menthol-Camphor (ICY HOT ADVANCED RELIEF) 16-11 % CREA Apply 1 application topically 2 (two) times daily as needed (pain). 56 g 0   metoprolol succinate (TOPROL-XL) 25 MG 24 hr tablet Take 1 tablet by mouth daily.  Multiple Vitamin (MULTI-VITAMIN) tablet Take 1 tablet by mouth daily.     naproxen (NAPROSYN) 375 MG tablet Take 1 tablet (375 mg total) by mouth 2 (two) times daily with a meal. 10 tablet 0   simvastatin (ZOCOR) 40 MG tablet Take 1 tablet by mouth at bedtime.     tadalafil (CIALIS) 10 MG tablet Take by mouth.     timolol (TIMOPTIC) 0.5 % ophthalmic solution      No current facility-administered medications on file prior to visit.    There are no Patient Instructions on file for this visit. No follow-ups on file.   Georgiana Spinner, NP

## 2023-12-07 DIAGNOSIS — N2889 Other specified disorders of kidney and ureter: Secondary | ICD-10-CM | POA: Diagnosis not present

## 2023-12-07 DIAGNOSIS — M545 Low back pain, unspecified: Secondary | ICD-10-CM | POA: Diagnosis not present

## 2023-12-07 DIAGNOSIS — I129 Hypertensive chronic kidney disease with stage 1 through stage 4 chronic kidney disease, or unspecified chronic kidney disease: Secondary | ICD-10-CM | POA: Diagnosis not present

## 2023-12-07 DIAGNOSIS — N1831 Chronic kidney disease, stage 3a: Secondary | ICD-10-CM | POA: Diagnosis not present

## 2023-12-07 DIAGNOSIS — R1314 Dysphagia, pharyngoesophageal phase: Secondary | ICD-10-CM | POA: Diagnosis not present

## 2024-02-01 DIAGNOSIS — H40153 Residual stage of open-angle glaucoma, bilateral: Secondary | ICD-10-CM | POA: Diagnosis not present

## 2024-02-06 DIAGNOSIS — I10 Essential (primary) hypertension: Secondary | ICD-10-CM | POA: Diagnosis not present

## 2024-02-13 DIAGNOSIS — Z87891 Personal history of nicotine dependence: Secondary | ICD-10-CM | POA: Diagnosis not present

## 2024-02-13 DIAGNOSIS — I129 Hypertensive chronic kidney disease with stage 1 through stage 4 chronic kidney disease, or unspecified chronic kidney disease: Secondary | ICD-10-CM | POA: Diagnosis not present

## 2024-02-13 DIAGNOSIS — N400 Enlarged prostate without lower urinary tract symptoms: Secondary | ICD-10-CM | POA: Diagnosis not present

## 2024-02-13 DIAGNOSIS — E7800 Pure hypercholesterolemia, unspecified: Secondary | ICD-10-CM | POA: Diagnosis not present

## 2024-02-13 DIAGNOSIS — Z0000 Encounter for general adult medical examination without abnormal findings: Secondary | ICD-10-CM | POA: Diagnosis not present

## 2024-02-13 DIAGNOSIS — Z1331 Encounter for screening for depression: Secondary | ICD-10-CM | POA: Diagnosis not present

## 2024-02-13 DIAGNOSIS — N1831 Chronic kidney disease, stage 3a: Secondary | ICD-10-CM | POA: Diagnosis not present

## 2024-02-16 ENCOUNTER — Ambulatory Visit: Admission: EM | Admit: 2024-02-16 | Discharge: 2024-02-16 | Disposition: A | Discharge status: Home / Self Care

## 2024-02-16 DIAGNOSIS — M109 Gout, unspecified: Secondary | ICD-10-CM | POA: Diagnosis not present

## 2024-02-16 DIAGNOSIS — Z8679 Personal history of other diseases of the circulatory system: Secondary | ICD-10-CM

## 2024-02-16 NOTE — ED Provider Notes (Signed)
 [facility]    CSN: [id number] Arrival date & time: 02/16/24  0920      History   Chief Complaint Chief Complaint  Patient presents with   Gout    HPI Gabriel Thompson is a 89 y.o. male.   89 year old male pt, Gabriel Thompson, presents to urgent care for evaluation of left foot pain.  Pt eats lots of processed foods, denies alcohol use. No treatment PTA.  PMH: CKD (stage 3), High cholesterol, HTN, Colon cancer  The history is provided by the patient. No language interpreter was used.    Past Medical History:  Diagnosis Date   Colon cancer (HCC)    High cholesterol    Hypertension     Patient Active Problem List   Diagnosis Date Noted   Acute gout involving toe of left foot 02/16/2024   History of hypertension 02/16/2024   Stage 3a chronic kidney disease (HCC) 01/18/2023   Hyperlipidemia 11/21/2014   Hypertension 08/21/2014    Past Surgical History:  Procedure Laterality Date   COLON SURGERY         Home Medications    Prior to Admission medications   Medication Sig Start Date End Date Taking? Authorizing Provider  amLODipine (NORVASC) 10 MG tablet Take 1 tablet by mouth daily. 11/27/23 11/26/24  [provider]  aspirin 81 MG chewable tablet Chew by mouth. 03/22/22   [provider]  atorvastatin (LIPITOR) 80 MG tablet Take by mouth. 03/22/22   [provider]  azithromycin (ZITHROMAX) 250 MG tablet Take 1 tablet (250 mg total) by mouth daily. Take first 2 tablets together, then 1 every day until finished. 11/03/22   White, Elita Boone, NP  benzonatate (TESSALON) 100 MG capsule Take 1 capsule (100 mg total) by mouth every 8 (eight) hours. 11/03/22   Valinda Hoar, NP  bicalutamide (CASODEX) 50 MG tablet Take 50 mg by mouth daily. 12/24/17   [provider]  clopidogrel (PLAVIX) 75 MG tablet Take by mouth. 03/22/22   [provider]  finasteride (PROSCAR) 5 MG tablet Take 5 mg by mouth daily.    [provider]  hydrochlorothiazide (HYDRODIURIL) 25 MG tablet Take 25 mg by mouth daily. 09/13/18   [provider]  ibuprofen (ADVIL) 800 MG tablet Take 1 tablet (800 mg total) by mouth 3 (three) times daily. 11/03/22   White, Elita Boone, NP  losartan (COZAAR) 50 MG tablet Take 1 tablet by mouth daily. 03/23/22   [provider]  Menthol-Camphor (ICY HOT ADVANCED RELIEF) 16-11 % CREA Apply 1 application topically 2 (two) times daily as needed (pain). 12/05/18   Schaevitz, Myra Rude, MD  metoprolol succinate (TOPROL-XL) 25 MG 24 hr tablet Take 1 tablet by mouth daily. 04/12/22   [provider]  Multiple Vitamin (MULTI-VITAMIN) tablet Take 1 tablet by mouth daily.    [provider]  naproxen (NAPROSYN) 375 MG tablet Take 1 tablet (375 mg total) by mouth 2 (two) times daily with a meal. 12/05/18   Schaevitz, Myra Rude, MD  simvastatin (ZOCOR) 40 MG tablet Take 1 tablet by mouth at bedtime. 11/28/23   [provider]  tadalafil (CIALIS) 10 MG tablet Take by mouth. 07/14/22   [provider]  timolol (TIMOPTIC) 0.5 % ophthalmic solution  08/28/23   [provider]    Family History History reviewed. No pertinent family history.  Social History Social History   Tobacco Use   Smoking status: Former   Smokeless  tobacco: Never  Vaping Use   Vaping status: Never Used  Substance Use Topics   Alcohol use: Never   Drug use: Never     Allergies   Patient has no known allergies.   Review of Systems Review of Systems  Constitutional:  Negative for fever.  Musculoskeletal:  Positive for arthralgias, gait problem and joint swelling.  Skin:  Positive for color change. Negative for pallor, rash and wound.  All other systems reviewed and are negative.    Physical Exam Triage Vital Signs ED Triage Vitals  Encounter Vitals Group     BP 02/16/24 0941 (!) 167/72     Systolic BP Percentile --      Diastolic BP Percentile --       Pulse Rate 02/16/24 0941 (!) 57     Resp --      Temp 02/16/24 0941 (!) 97.4 F (36.3 C)     Temp Source 02/16/24 0941 Oral     SpO2 02/16/24 0941 99 %     Weight 02/16/24 0938 148 lb (67.1 kg)     Height 02/16/24 0938 5\' 7"  (1.702 m)     Head Circumference --      Peak Flow --      Pain Score 02/16/24 1021 3     Pain Loc --      Pain Education --      Exclude from Growth Chart --    No data found.  Updated Vital Signs BP (!) 167/72 (BP Location: Left Arm)   Pulse (!) 57   Temp (!) 97.4 F (36.3 C) (Oral)   Ht 5\' 7"  (1.702 m)   Wt 148 lb (67.1 kg)   SpO2 99%   BMI 23.18 kg/m   Visual Acuity Right Eye Distance:   Left Eye Distance:   Bilateral Distance:    Right Eye Near:   Left Eye Near:    Bilateral Near:     Physical Exam Vitals and nursing note reviewed.  Constitutional:      Appearance: Normal appearance. He is well-developed and well-groomed.  HENT:     Head: Normocephalic.  Cardiovascular:     Rate and Rhythm: Normal rate.     Pulses:          Dorsalis pedis pulses are 2+ on the left side.  Musculoskeletal:       Feet:  Feet:     Left foot:     Skin integrity: Erythema and warmth present.     Comments: Left great toe erythema,warmth to touch, +TTP. Skin intact Neurological:     General: No focal deficit present.     Mental Status: He is alert and oriented to person, place, and time.     GCS: GCS eye subscore is 4. GCS verbal subscore is 5. GCS motor subscore is 6.     Cranial Nerves: No cranial nerve deficit.     Sensory: No sensory deficit.  Psychiatric:        Attention and Perception: Attention normal.        Mood and Affect: Mood normal.        Speech: Speech normal.        Behavior: Behavior normal. Behavior is cooperative.      UC Treatments / Results  Labs (all labs ordered are listed, but only abnormal results are displayed) Labs Reviewed - No data to display  EKG   Radiology No results found.  Procedures Procedures  (including critical care time)  Medications  Ordered in UC Medications - No data to display  Initial Impression / Assessment and Plan / UC Course  I have reviewed the triage vital signs and the nursing notes.  Pertinent labs & imaging results that were available during my care of the patient were reviewed by me and considered in my medical decision making (see chart for details).  Clinical Course as of 02/16/24 1051  Fri Feb 16, 2024  1011 Discussed with pt and son in law given PMH: CKD stage 3, on Eloquis, would not recommend NSAIDS, tylenol as label directed, low purine diet(handout given), drink plenty of water, pt and familyt both verbalized understanfding to this provider [JD]  1011 Pt has chronic medical issues related to  gout, HTN,CKD, Discussed exam findings and plan of care with pt, pt verbalized understanding to this provider.  [JD]    Clinical Course User Index [JD] [place], Para March, NP  Discussed exam findings and plan of care with patient, strict go to ER precautions given.   Patient verbalized understanding to this provider.  Ddx: Gout, arthritis, cellulitis, insect bite Final Clinical Impressions(s) / UC Diagnoses   Final diagnoses:  Acute gout involving toe of left foot, unspecified cause  History of hypertension     Discharge Instructions      May use over the counter biofreeze or lidocaine gel topically to left great toe as label directed.  May take tylenol as label directed for pain Check strength on bottle no more than 1000 mg every 6 hours  Low purine diet(handout given)  Go to Er for new or worsening issues or concerns  Drink plenty of water  Avoid caffeine    ED Prescriptions   None    PDMP not reviewed this encounter.   Clancy Gourd, NP 02/16/24 1051

## 2024-02-16 NOTE — ED Triage Notes (Addendum)
 Pt is with his son in law   Pt c/o left foot pain x2days  Pt believes it to be gout  Pt does not know his medicines.   Pt does not under stand the pain scale and states that his pain is always with him

## 2024-02-16 NOTE — Discharge Instructions (Addendum)
 May use over the counter biofreeze or lidocaine gel topically to left great toe as label directed.  May take tylenol as label directed for pain Check strength on bottle no more than 1000 mg every 6 hours  Low purine diet(handout given)  Go to Er for new or worsening issues or concerns  Drink plenty of water  Avoid caffeine

## 2024-03-26 ENCOUNTER — Encounter (INDEPENDENT_AMBULATORY_CARE_PROVIDER_SITE_OTHER): Payer: Self-pay

## 2024-04-16 ENCOUNTER — Other Ambulatory Visit (INDEPENDENT_AMBULATORY_CARE_PROVIDER_SITE_OTHER): Payer: Self-pay | Admitting: Nurse Practitioner

## 2024-04-16 DIAGNOSIS — I7143 Infrarenal abdominal aortic aneurysm, without rupture: Secondary | ICD-10-CM

## 2024-04-17 ENCOUNTER — Ambulatory Visit (INDEPENDENT_AMBULATORY_CARE_PROVIDER_SITE_OTHER): Payer: Medicare HMO | Admitting: Vascular Surgery

## 2024-04-17 ENCOUNTER — Ambulatory Visit (INDEPENDENT_AMBULATORY_CARE_PROVIDER_SITE_OTHER): Payer: Medicare HMO

## 2024-04-17 ENCOUNTER — Encounter (INDEPENDENT_AMBULATORY_CARE_PROVIDER_SITE_OTHER): Payer: Self-pay | Admitting: Vascular Surgery

## 2024-04-17 VITALS — BP 183/78 | HR 58 | Resp 16 | Wt 143.8 lb

## 2024-04-17 DIAGNOSIS — M1A0720 Idiopathic chronic gout, left ankle and foot, without tophus (tophi): Secondary | ICD-10-CM | POA: Diagnosis not present

## 2024-04-17 DIAGNOSIS — I7143 Infrarenal abdominal aortic aneurysm, without rupture: Secondary | ICD-10-CM

## 2024-04-17 DIAGNOSIS — I10 Essential (primary) hypertension: Secondary | ICD-10-CM

## 2024-04-17 DIAGNOSIS — E785 Hyperlipidemia, unspecified: Secondary | ICD-10-CM

## 2024-04-17 NOTE — Progress Notes (Signed)
 Subjective:    Patient ID: Gabriel Thompson, male    DOB: [birth year], 89 y.o.   MRN: [id number] Chief Complaint  Patient presents with   Follow-up    Ultrasound follow up    The patient presents to the office for follow up evaluation of an abdominal aortic aneurysm. The aneurysm was found incidentally by CT scan. Patient denies abdominal pain or unusual back pain, no other abdominal complaints.  No history of an abrupt onset of a painful toe associated with blue discoloration.      No family history of AAA.    Patient denies amaurosis fugax or TIA symptoms. There is no history of claudication or rest pain symptoms of the lower extremities.  The patient denies angina or shortness of breath.   CT scan dated 11/05/2024 shows an AAA that measures 3.5x3.6 cm and additionally a 1.5 cm of the right common iliac artery was also detected that now measures 1.9 Cm.  Follow-up ultrasound scan today shows the AAA measures 3.9 cm at its largest width.      Review of Systems  Constitutional: Negative.   Cardiovascular:        History of AAA.  Denies any signs or symptoms of rupture  All other systems reviewed and are negative.      Objective:    Physical Exam Vitals reviewed.  Constitutional:      Appearance: Normal appearance. He is normal weight.  HENT:     Head: Normocephalic.  Eyes:     Pupils: Pupils are equal, round, and reactive to light.  Cardiovascular:     Rate and Rhythm: Normal rate and regular rhythm.     Pulses: Normal pulses.     Heart sounds: Normal heart sounds.  Pulmonary:     Effort: Pulmonary effort is normal.     Breath sounds: Normal breath sounds.  Abdominal:     General: Abdomen is flat. Bowel sounds are normal.     Palpations: Abdomen is soft.  Musculoskeletal:        General: Normal range of motion.     Cervical back: Normal range of motion.  Skin:    General: Skin is warm and dry.     Capillary Refill: Capillary refill takes 2 to 3 seconds.   Neurological:     General: No focal deficit present.     Mental Status: He is alert and oriented to person, place, and time. Mental status is at baseline.  Psychiatric:        Mood and Affect: Mood normal.        Behavior: Behavior normal.        Thought Content: Thought content normal.        Judgment: Judgment normal.    BP (!) 183/78   Pulse (!) 58   Resp 16   Wt 143 lb 12.8 oz (65.2 kg)   BMI 22.52 kg/m   Past Medical History:  Diagnosis Date   Colon cancer (HCC)    High cholesterol    Hypertension     Social History   Socioeconomic History   Marital status: Married    Spouse name: Not on file   Number of children: Not on file   Years of education: Not on file   Highest education level: Not on file  Occupational History   Not on file  Tobacco Use   Smoking status: Former   Smokeless tobacco: Never  Vaping Use   Vaping status: Never Used  Substance and  Sexual Activity   Alcohol  use: Never   Drug use: Never   Sexual activity: Not on file  Other Topics Concern   Not on file  Social History Narrative   Not on file   Social Drivers of Health   Financial Resource Strain: Low Risk  (03/20/2022)   Received from [facility], [facility]   Overall Financial Resource Strain (CARDIA)    Difficulty of Paying Living Expenses: Not very hard  Food Insecurity: No Food Insecurity (03/20/2022)   Received from [facility], [facility]   Hunger Vital Sign    Worried About Running Out of Food in the Last Year: Never true    Ran Out of Food in the Last Year: Never true  Transportation Needs: No Transportation Needs (03/20/2022)   Received from [facility], [facility]   [facility] - Transportation    Lack of Transportation (Medical): No    Lack of Transportation (Non-Medical): No  Physical Activity: Not on file  Stress: Not on file  Social Connections: Not on file  Intimate Partner Violence: Not on file    Past Surgical History:  Procedure  Laterality Date   COLON SURGERY      History reviewed. No pertinent family history.  No Known Allergies     Latest Ref Rng & Units 12/05/2018    2:24 PM 07/11/2012    2:20 PM  CBC  WBC 4.0 - 10.5 K/uL 9.5    Hemoglobin 13.0 - 17.0 g/dL 04.5  40.9   Hematocrit 39.0 - 52.0 % 44.6    Platelets 150 - 400 K/uL 229         CMP     Component Value Date/Time   NA 140 12/05/2018 1506   K 4.3 12/05/2018 1506   CL 104 12/05/2018 1506   CO2 29 12/05/2018 1506   GLUCOSE 151 (H) 12/05/2018 1506   BUN 17 12/05/2018 1506   CREATININE 1.40 (H) 01/18/2019 1450   CALCIUM 9.4 12/05/2018 1506   GFRNONAA 49 (L) 12/05/2018 1506     No results found.     Assessment & Plan:   1. Infrarenal abdominal aortic aneurysm (AAA) without rupture (HCC) (Primary) Recommend: No surgery or intervention is indicated at this time.   The patient has an asymptomatic abdominal aortic aneurysm that is less than 4 cm in maximal diameter.     I have reviewed the natural history of abdominal aortic aneurysm and the small risk of rupture for aneurysm less than 5 cm in size.   However, as these small aneurysms tend to enlarge over time, continued surveillance with ultrasound or CT scan is mandatory. On Vascular ultrasound today patient's AAA measures 3.9 cm.  Last known measurement was a CT done at [facility] 6 months ago which showed the patient's AAA at 3.6 cm.   I have also discussed optimizing medical management with hypertension and lipid control and the negative effect that any tobacco products have on aneurysmal disease.  The patient is also encouraged to exercise a minimum of 30 minutes 4 times a week.    Should the patient develop new onset abdominal or back pain or signs of peripheral embolization they are instructed to seek medical attention immediately and to alert the physician providing care that they have an aneurysm.    The patient voices their understanding.   Because this is the follow up evaluation  we will have the patient return in 6 months to ensure there has not  been rapid growth.  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 Hypertension, unspecified type Continue antihypertensive medications as already ordered, these medications have been reviewed and there are no changes at this time.  3. Hyperlipidemia, unspecified hyperlipidemia type Continue statin as ordered and reviewed, no changes at this time  4. Idiopathic chronic gout of left foot without tophus Continue anti-gout medications as already ordered, these medications have been reviewed and there are no changes at this time.   Current Outpatient Medications on File Prior to Visit  Medication Sig Dispense Refill   amLODipine (NORVASC) 10 MG tablet Take 1 tablet by mouth daily.     aspirin 81 MG chewable tablet Chew by mouth.     atorvastatin (LIPITOR) 80 MG tablet Take by mouth.     benzonatate  (TESSALON ) 100 MG capsule Take 1 capsule (100 mg total) by mouth every 8 (eight) hours. 21 capsule 0   bicalutamide (CASODEX) 50 MG tablet Take 50 mg by mouth daily.     clopidogrel (PLAVIX) 75 MG tablet Take by mouth.     finasteride (PROSCAR) 5 MG tablet Take 5 mg by mouth daily.     hydrochlorothiazide (HYDRODIURIL) 25 MG tablet Take 25 mg by mouth daily.     ibuprofen  (ADVIL ) 800 MG tablet Take 1 tablet (800 mg total) by mouth 3 (three) times daily. 21 tablet 0   losartan (COZAAR) 50 MG tablet Take 1 tablet by mouth daily.     Menthol -Camphor (ICY HOT ADVANCED RELIEF) 16-11 % CREA Apply 1 application topically 2 (two) times daily as needed (pain). 56 g 0   metoprolol succinate (TOPROL-XL) 25 MG 24 hr tablet Take 1 tablet by mouth daily.     Multiple Vitamin (MULTI-VITAMIN) tablet Take 1 tablet by mouth daily.     naproxen  (NAPROSYN ) 375 MG tablet Take 1 tablet (375 mg total) by mouth 2 (two) times daily with a meal. 10 tablet 0   simvastatin (ZOCOR) 40 MG tablet Take 1 tablet by mouth at bedtime.      tadalafil (CIALIS) 10 MG tablet Take by mouth.     timolol (TIMOPTIC) 0.5 % ophthalmic solution      azithromycin  (ZITHROMAX ) 250 MG tablet Take 1 tablet (250 mg total) by mouth daily. Take first 2 tablets together, then 1 every day until finished. (Patient not taking: Reported on 04/17/2024) 6 tablet 0   No current facility-administered medications on file prior to visit.    There are no Patient Instructions on file for this visit. No follow-ups on file.   Annamaria Barrette, NP

## 2024-05-13 DIAGNOSIS — H40153 Residual stage of open-angle glaucoma, bilateral: Secondary | ICD-10-CM | POA: Diagnosis not present

## 2024-08-15 DIAGNOSIS — I10 Essential (primary) hypertension: Secondary | ICD-10-CM | POA: Diagnosis not present

## 2024-08-22 DIAGNOSIS — Z0001 Encounter for general adult medical examination with abnormal findings: Secondary | ICD-10-CM | POA: Diagnosis not present

## 2024-08-22 DIAGNOSIS — N1831 Chronic kidney disease, stage 3a: Secondary | ICD-10-CM | POA: Diagnosis not present

## 2024-08-22 DIAGNOSIS — Z87891 Personal history of nicotine dependence: Secondary | ICD-10-CM | POA: Diagnosis not present

## 2024-08-22 DIAGNOSIS — I129 Hypertensive chronic kidney disease with stage 1 through stage 4 chronic kidney disease, or unspecified chronic kidney disease: Secondary | ICD-10-CM | POA: Diagnosis not present

## 2024-08-22 DIAGNOSIS — N400 Enlarged prostate without lower urinary tract symptoms: Secondary | ICD-10-CM | POA: Diagnosis not present

## 2024-08-22 DIAGNOSIS — Z23 Encounter for immunization: Secondary | ICD-10-CM | POA: Diagnosis not present

## 2024-08-22 DIAGNOSIS — E7800 Pure hypercholesterolemia, unspecified: Secondary | ICD-10-CM | POA: Diagnosis not present

## 2024-08-22 DIAGNOSIS — Z1331 Encounter for screening for depression: Secondary | ICD-10-CM | POA: Diagnosis not present

## 2024-09-05 ENCOUNTER — Encounter: Payer: Self-pay | Admitting: *Deleted

## 2024-09-05 ENCOUNTER — Ambulatory Visit
Admission: EM | Admit: 2024-09-05 | Discharge: 2024-09-05 | Disposition: A | Discharge status: Home / Self Care | Attending: Emergency Medicine | Admitting: Emergency Medicine

## 2024-09-05 DIAGNOSIS — I714 Abdominal aortic aneurysm, without rupture, unspecified: Secondary | ICD-10-CM | POA: Diagnosis not present

## 2024-09-05 DIAGNOSIS — Z8679 Personal history of other diseases of the circulatory system: Secondary | ICD-10-CM

## 2024-09-05 DIAGNOSIS — K30 Functional dyspepsia: Secondary | ICD-10-CM

## 2024-09-05 NOTE — ED Triage Notes (Signed)
 Pts son in law states that he had dark stools a couple days ago. Pt states all liquid stools X 3 days. He took some pepto

## 2024-09-05 NOTE — Discharge Instructions (Addendum)
 Push fluids(pedilyte,gatorade, jello,popsicles, avoid caffeine, avoid spicy, greasy, fried foods Avoid pepto as it may cause dark stools Food choices to help relieve diarrhea given If you have worsening symptoms(pain, bright red stool or continued dark stool), develop chest pain,palpitations,etc or unable to keep fluids down go immediately to Er for further evaluation.

## 2024-09-05 NOTE — ED Provider Notes (Signed)
 [facility]    CSN: [id number] Arrival date & time: 09/05/24  1007      History   Chief Complaint No chief complaint on file.   HPI Gabriel Thompson is a 89 y.o. male.   89 year old male pt, Gabriel Thompson, presents to urgent care for evaluation of upset stomach had 3 dark loose stoolsafter eating chinese food and taking Pepto Bismol, has since cleared up. Pt denies any abdominal pain, chest pain,palpitations or worsening issues. 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   Patient has a past medical history of prostate cancer, hypercholesterolemia, gout, hypertension, NSTEMI on Plavix.  No history of arrhythmia.  He denies a history of colon cancer/abdominal surgery.  The history is provided by the patient. No language interpreter was used.    Past Medical History:  Diagnosis Date   Colon cancer (HCC)    High cholesterol    Hypertension     Patient Active Problem List   Diagnosis Date Noted   Abdominal aortic aneurysm (AAA) without rupture 09/05/2024   Upset stomach 09/05/2024   Acute gout involving toe of left foot 02/16/2024   History of hypertension 02/16/2024   Stage 3a chronic kidney disease (HCC) 01/18/2023   Hyperlipidemia 11/21/2014   Hypertension 08/21/2014    Past Surgical History:  Procedure Laterality Date   COLON SURGERY         Home Medications    Prior to Admission medications   Medication Sig Start Date End Date Taking? Authorizing Provider  amLODipine (NORVASC) 10 MG tablet Take 1 tablet by mouth daily. 11/27/23 11/26/24 Yes [provider]  aspirin 81 MG chewable tablet Chew by mouth. 03/22/22  Yes [provider]  atorvastatin (LIPITOR) 80 MG tablet Take by mouth. 03/22/22  Yes [provider]  azithromycin  (ZITHROMAX ) 250 MG tablet Take 1 tablet (250 mg total) by mouth daily. Take first 2 tablets together, then 1 every day until finished. 11/03/22  Yes White, Adrienne R, NP  benzonatate   (TESSALON ) 100 MG capsule Take 1 capsule (100 mg total) by mouth every 8 (eight) hours. 11/03/22  Yes White, Shelba SAUNDERS, NP  bicalutamide (CASODEX) 50 MG tablet Take 50 mg by mouth daily. 12/24/17  Yes [provider]  clopidogrel (PLAVIX) 75 MG tablet Take by mouth. 03/22/22  Yes [provider]  finasteride (PROSCAR) 5 MG tablet Take 5 mg by mouth daily.   Yes [provider]  hydrochlorothiazide (HYDRODIURIL) 25 MG tablet Take 25 mg by mouth daily. 09/13/18  Yes [provider]  ibuprofen  (ADVIL ) 800 MG tablet Take 1 tablet (800 mg total) by mouth 3 (three) times daily. 11/03/22  Yes White, Adrienne R, NP  losartan (COZAAR) 50 MG tablet Take 1 tablet by mouth daily. 03/23/22  Yes [provider]  Menthol -Camphor (ICY HOT ADVANCED RELIEF) 16-11 % CREA Apply 1 application topically 2 (two) times daily as needed (pain). 12/05/18  Yes Schaevitz, Alm Cough, MD  metoprolol succinate (TOPROL-XL) 25 MG 24 hr tablet Take 1 tablet by mouth daily. 04/12/22  Yes [provider]  Multiple Vitamin (MULTI-VITAMIN) tablet Take 1 tablet by mouth daily.   Yes [provider]  naproxen  (NAPROSYN ) 375 MG tablet Take 1 tablet (375 mg total) by mouth 2 (two) times daily with a meal. 12/05/18  Yes Schaevitz, Alm Cough, MD  simvastatin (ZOCOR) 40 MG tablet Take 1 tablet by mouth at bedtime. 11/28/23  Yes [provider]  tadalafil (CIALIS)  10 MG tablet Take by mouth. 07/14/22  Yes [provider]  timolol (TIMOPTIC) 0.5 % ophthalmic solution  08/28/23  Yes [provider]    Family History History reviewed. No pertinent family history.  Social History Social History   Tobacco Use   Smoking status: Former   Smokeless tobacco: Never  Advertising Account Planner   Vaping status: Never Used  Substance Use Topics   Alcohol  use: Never   Drug use: Never     Allergies   Patient has no known allergies.   Review of Systems Review of Systems   Constitutional:  Negative for fever.  Cardiovascular:  Negative for chest pain and palpitations.  Gastrointestinal:  Positive for diarrhea. Negative for abdominal pain, nausea and vomiting.       Dark stool,no red or maroon stool  All other systems reviewed and are negative.    Physical Exam Triage Vital Signs ED Triage Vitals  Encounter Vitals Group     BP 09/05/24 1020 (!) 156/70     Girls Systolic BP Percentile --      Girls Diastolic BP Percentile --      Boys Systolic BP Percentile --      Boys Diastolic BP Percentile --      Pulse Rate 09/05/24 1020 60     Resp 09/05/24 1020 18     Temp 09/05/24 1020 98 F (36.7 C)     Temp Source 09/05/24 1020 Oral     SpO2 09/05/24 1020 98 %     Weight --      Height --      Head Circumference --      Peak Flow --      Pain Score 09/05/24 1018 0     Pain Loc --      Pain Education --      Exclude from Growth Chart --    No data found.  Updated Vital Signs BP (!) 144/70 (BP Location: Left Arm)   Pulse 60   Temp 98 F (36.7 C) (Oral)   Resp 18   SpO2 98%   Visual Acuity Right Eye Distance:   Left Eye Distance:   Bilateral Distance:    Right Eye Near:   Left Eye Near:    Bilateral Near:     Physical Exam Vitals and nursing note reviewed.  Constitutional:      General: He is not in acute distress.    Appearance: He is well-developed and well-groomed.  HENT:     Head: Normocephalic and atraumatic.  Eyes:     Conjunctiva/sclera: Conjunctivae normal.  Cardiovascular:     Rate and Rhythm: Normal rate and regular rhythm.     Heart sounds: Normal heart sounds. No murmur heard. Pulmonary:     Effort: Pulmonary effort is normal. No respiratory distress.     Breath sounds: Normal breath sounds and air entry.  Abdominal:     General: Bowel sounds are normal.     Palpations: Abdomen is soft.     Tenderness: There is no abdominal tenderness. There is no guarding or rebound.  Musculoskeletal:        General: No  swelling.     Cervical back: Neck supple.  Skin:    General: Skin is warm and dry.     Capillary Refill: Capillary refill takes less than 2 seconds.  Neurological:     General: No focal deficit present.     Mental Status: He is alert and oriented to person, place,  and time.     GCS: GCS eye subscore is 4. GCS verbal subscore is 5. GCS motor subscore is 6.  Psychiatric:        Mood and Affect: Mood normal.        Behavior: Behavior is cooperative.      UC Treatments / Results  Labs (all labs ordered are listed, but only abnormal results are displayed) Labs Reviewed - No data to display  EKG   Radiology No results found.  Procedures Procedures (including critical care time)  Medications Ordered in UC Medications - No data to display  Initial Impression / Assessment and Plan / UC Course  I have reviewed the triage vital signs and the nursing notes.  Pertinent labs & imaging results that were available during my care of the patient were reviewed by me and considered in my medical decision making (see chart for details).    Discussed exam findings and plan of care with patient, patient denies any abdominal pain , no nausea , no vomiting , no fever , no bright red stool , attributes dark stool after taking dose of Pepto-Bismol , which has since resolved , strict go to ER precautions given.   Patient verbalized understanding to this provider.  Ddx: Upset stomach,viral illness, food allergy/posioning, Gi bleed, AAA, HTN Final Clinical Impressions(s) / UC Diagnoses   Final diagnoses:  Upset stomach  Abdominal aortic aneurysm (AAA) without rupture, unspecified part  History of hypertension     Discharge Instructions      Push fluids(pedilyte,gatorade, jello,popsicles, avoid caffeine, avoid spicy, greasy, fried foods Avoid pepto as it may cause dark stools Food choices to help relieve diarrhea given If you have worsening symptoms(pain, bright red stool or continued dark  stool), develop chest pain,palpitations,etc or unable to keep fluids down go immediately to Er for further evaluation.      ED Prescriptions   None    PDMP not reviewed this encounter.   Aminta Loose, NP 09/05/24 2024

## 2024-09-17 DIAGNOSIS — H40153 Residual stage of open-angle glaucoma, bilateral: Secondary | ICD-10-CM | POA: Diagnosis not present

## 2024-10-08 ENCOUNTER — Other Ambulatory Visit (INDEPENDENT_AMBULATORY_CARE_PROVIDER_SITE_OTHER): Payer: Self-pay | Admitting: Nurse Practitioner

## 2024-10-08 DIAGNOSIS — I7143 Infrarenal abdominal aortic aneurysm, without rupture: Secondary | ICD-10-CM

## 2024-10-16 NOTE — Progress Notes (Deleted)
 MRN : [id number]  Gabriel Thompson is a 89 y.o. (Feb 06, 1934) male who presents with chief complaint of check AAA.  History of Present Illness:   The patient presents to the office for follow up evaluation of an abdominal aortic aneurysm. The aneurysm was found incidentally by CT scan. Patient denies abdominal pain or unusual back pain, no other abdominal complaints.  No history of an abrupt onset of a painful toe associated with blue discoloration.      No family history of AAA.    Patient denies amaurosis fugax or TIA symptoms. There is no history of claudication or rest pain symptoms of the lower extremities.  The patient denies angina or shortness of breath.   CT scan dated 11/05/2024 shows an AAA that measures 3.5x3.6 cm and additionally a 1.5 cm of the right common iliac artery was also detected that now measures 1.9 Cm.   Follow-up ultrasound scan today shows the AAA measures 3.9 cm at its largest width.   No outpatient medications have been marked as taking for the 10/17/24 encounter (Appointment) with Jama, Cordella MATSU, MD.    Past Medical History:  Diagnosis Date   Colon cancer (HCC)    High cholesterol    Hypertension     Past Surgical History:  Procedure Laterality Date   COLON SURGERY      Social History Social History   Tobacco Use   Smoking status: Former   Smokeless tobacco: Never  Vaping Use   Vaping status: Never Used  Substance Use Topics   Alcohol  use: Never   Drug use: Never    Family History No family history on file.  No Known Allergies   REVIEW OF SYSTEMS (Negative unless checked)  Constitutional: [] Weight loss  [] Fever  [] Chills Cardiac: [] Chest pain   [] Chest pressure   [] Palpitations   [] Shortness of breath when laying flat   [] Shortness of breath with exertion. Vascular:  [x] Pain in legs with walking   [] Pain in legs at rest  [] History of DVT   [] Phlebitis    [] Swelling in legs   [] Varicose veins   [] Non-healing ulcers Pulmonary:   [] Uses home oxygen   [] Productive cough   [] Hemoptysis   [] Wheeze  [] COPD   [] Asthma Neurologic:  [] Dizziness   [] Seizures   [] History of stroke   [] History of TIA  [] Aphasia   [] Vissual changes   [] Weakness or numbness in arm   [] Weakness or numbness in leg Musculoskeletal:   [] Joint swelling   [] Joint pain   [] Low back pain Hematologic:  [] Easy bruising  [] Easy bleeding   [] Hypercoagulable state   [] Anemic Gastrointestinal:  [] Diarrhea   [] Vomiting  [] Gastroesophageal reflux/heartburn   [] Difficulty swallowing. Genitourinary:  [] Chronic kidney disease   [] Difficult urination  [] Frequent urination   [] Blood in urine Skin:  [] Rashes   [] Ulcers  Psychological:  [] History of anxiety   []  History of major depression.  Physical Examination  There were no vitals filed for this visit. There is no height or weight on file to calculate BMI. Gen: WD/WN, NAD Head: [place]/AT, No temporalis wasting.  Ear/Nose/Throat: Hearing  grossly intact, nares w/o erythema or drainage Eyes: PER, EOMI, sclera nonicteric.  Neck: Supple, no masses.  No bruit or JVD.  Pulmonary:  Good air movement, no audible wheezing, no use of accessory muscles.  Cardiac: RRR, normal S1, S2, no Murmurs. Vascular:  mild trophic changes, no open wounds Vessel Right Left  Radial Palpable Palpable  PT Not Palpable Not Palpable  DP Not Palpable Not Palpable  Gastrointestinal: soft, non-distended. No guarding/no peritoneal signs.  Musculoskeletal: M/S 5/5 throughout.  No visible deformity.  Neurologic: CN 2-12 intact. Pain and light touch intact in extremities.  Symmetrical.  Speech is fluent. Motor exam as listed above. Psychiatric: Judgment intact, Mood & affect appropriate for pt's clinical situation. Dermatologic: No rashes or ulcers noted.  No changes consistent with cellulitis.   CBC Lab Results  Component Value Date   WBC 9.5 12/05/2018   HGB 14.1  12/05/2018   HCT 44.6 12/05/2018   MCV 92.1 12/05/2018   PLT 229 12/05/2018    BMET    Component Value Date/Time   NA 140 12/05/2018 1506   K 4.3 12/05/2018 1506   CL 104 12/05/2018 1506   CO2 29 12/05/2018 1506   GLUCOSE 151 (H) 12/05/2018 1506   BUN 17 12/05/2018 1506   CREATININE 1.40 (H) 01/18/2019 1450   CALCIUM 9.4 12/05/2018 1506   GFRNONAA 49 (L) 12/05/2018 1506   GFRAA 57 (L) 12/05/2018 1506   CrCl cannot be calculated (Patient's most recent lab result is older than the maximum 21 days allowed.).  COAG No results found for: INR, PROTIME  Radiology No results found.   Assessment/Plan There are no diagnoses linked to this encounter.   Cordella Shawl, MD  10/16/2024 4:06 PM

## 2024-10-17 ENCOUNTER — Ambulatory Visit (INDEPENDENT_AMBULATORY_CARE_PROVIDER_SITE_OTHER): Admitting: Vascular Surgery

## 2024-10-17 ENCOUNTER — Other Ambulatory Visit (INDEPENDENT_AMBULATORY_CARE_PROVIDER_SITE_OTHER)
